# Patient Record
Sex: Female | Born: 1975
Health system: Southern US, Community
[De-identification: ages and names within clinical notes are randomized; demographics above are authoritative.]

## PROBLEM LIST (undated history)

## (undated) DIAGNOSIS — R112 Nausea with vomiting, unspecified: Secondary | ICD-10-CM

## (undated) DIAGNOSIS — Z8719 Personal history of other diseases of the digestive system: Secondary | ICD-10-CM

## (undated) DIAGNOSIS — Z789 Other specified health status: Secondary | ICD-10-CM

## (undated) DIAGNOSIS — Z8711 Personal history of peptic ulcer disease: Secondary | ICD-10-CM

## (undated) DIAGNOSIS — F99 Mental disorder, not otherwise specified: Secondary | ICD-10-CM

## (undated) DIAGNOSIS — Z9889 Other specified postprocedural states: Secondary | ICD-10-CM

## (undated) HISTORY — PX: NASAL SINUS SURGERY: SHX719

## (undated) HISTORY — PX: ESOPHAGOGASTRODUODENOSCOPY: SHX1529

## (undated) HISTORY — PX: TONSILLECTOMY: SUR1361

## (undated) HISTORY — DX: Personal history of peptic ulcer disease: Z87.11

## (undated) HISTORY — DX: Mental disorder, not otherwise specified: F99

---
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
## 2010-07-08 ENCOUNTER — Inpatient Hospital Stay (HOSPITAL_COMMUNITY)
Admission: AD | Admit: 2010-07-08 | Discharge: 2010-07-11 | Payer: Self-pay | Source: Home / Self Care | Attending: Obstetrics and Gynecology | Admitting: Obstetrics and Gynecology

## 2010-07-14 LAB — CBC
HCT: 34.7 % — ABNORMAL LOW (ref 36.0–46.0)
HCT: 29.7 % — ABNORMAL LOW (ref 36.0–46.0)
Hemoglobin: 12.1 g/dL (ref 12.0–15.0)
Hemoglobin: 10.4 g/dL — ABNORMAL LOW (ref 12.0–15.0)
MCH: 31.8 pg (ref 26.0–34.0)
MCH: 31.8 pg (ref 26.0–34.0)
MCHC: 34.9 g/dL (ref 30.0–36.0)
MCHC: 35 g/dL (ref 30.0–36.0)
MCV: 91.3 fL (ref 78.0–100.0)
MCV: 90.8 fL (ref 78.0–100.0)
Platelets: 130 10*3/uL — ABNORMAL LOW (ref 150–400)
Platelets: 123 10*3/uL — ABNORMAL LOW (ref 150–400)
RBC: 3.8 MIL/uL — ABNORMAL LOW (ref 3.87–5.11)
RBC: 3.27 MIL/uL — ABNORMAL LOW (ref 3.87–5.11)
RDW: 13.8 % (ref 11.5–15.5)
RDW: 13.4 % (ref 11.5–15.5)
WBC: 7.1 10*3/uL (ref 4.0–10.5)
WBC: 10 10*3/uL (ref 4.0–10.5)

## 2010-07-14 LAB — RPR: RPR Ser Ql: NONREACTIVE

## 2010-07-18 NOTE — Op Note (Signed)
Carol Rodgers, Carol Rodgers                ACCOUNT NO.:  [id number]  MEDICAL RECORD NO.:  [id number]          PATIENT TYPE:  INP  LOCATION:  9109                          FACILITY:  [facility]  PHYSICIAN:  Avri Paiva L. Peretz Thieme, M.D.DATE OF BIRTH:  [birth year]  DATE OF PROCEDURE:  07/08/2010 DATE OF DISCHARGE:                              OPERATIVE REPORT   PREOPERATIVE DIAGNOSES:  Intrauterine pregnancy at 40 and 4, arrest of dilation, and nonreassuring fetal heart rate.  POSTOPERATIVE DIAGNOSES:  Intrauterine pregnancy at 40 and 4, arrest of dilation, and nonreassuring fetal heart rate.  PROCEDURE:  Primary low transverse cesarean section.  SURGEON:  Janelly Switalski L. Vincente Poli, M.D.  ANESTHESIA:  Epidural.  FINDINGS:  Female infant in OP position weighing 7 pounds 7 ounces.  PATHOLOGY:  None.  ESTIMATED BLOOD LOSS:  500 mL.  COMPLICATIONS:  None.  PROCEDURE:  The patient was taken to the operating room, it was found after her epidural that she started to have kind of an unstable baseline with repetitive deep variable decelerations with late return.  She had noted to still be 1.5 cm dilated.  After risk and benefits were reviewed of the C-section.  The patient and her spouse agreed to proceed, we went back to the operating room #1, her epidural had been dosed by Dr. Malen Gauze and found to be adequate.  She was prepped and draped.  A Foley catheter had been inserted while in labor and delivery.  A low transverse incision was made, carried down to the fascia, the fascia was scored in the midline extended laterally using rectus muscles.  The peritoneum was entered bluntly, and the peritoneal incision was then stretched.  The bladder blade was inserted, the lower uterine segment was then identified and the bladder flap was created sharply and then digitally. The bladder blade was then readjusted, a low transverse incision was made in the uterus.  The uterus was entered using a hemostat.  The baby was  in OT position, was delivered easily, was a female infant Apgars 9 at 1 minute and 9 at 5 minutes and weighed 7 pounds 7 ounces.  The cord was clamped and cut.  The baby was handed to the awaiting pediatricians and taken to the nursery.  The placenta was manually removed, noted to be normal intact with a three-vessel cord.  Antibiotics and Pitocin were given.  The uterus was exteriorized and cleared of all clots and debris. The uterine incision was closed in one layer using 0 chromic in a running locked running locked stitch.  The uterus was returned to the abdomen.  Irrigation was performed.  The peritoneum was reapproximated using 0 Vicryl.  The fascia was closed using 0 Vicryl in running stitch after irrigation of subcutaneous layer noting hemostasis.  The skin was closed with a 4-0 Vicryl on a Keith needle.  Dermabond was applied.  All sponge, lap, and instrument counts were correct x2.  The patient went to recovery room in stable condition     Laveta Gilkey L. Vincente Poli, M.D.     Florestine Avers  D:  07/08/2010  T:  07/09/2010  Job:  [id number]  Electronically  Signed by Marcelle Overlie M.D. on 07/18/2010 08:43:55 AM

## 2010-07-30 NOTE — Discharge Summary (Signed)
Carol Rodgers, MALLINGER                ACCOUNT NO.:  [id number]  MEDICAL RECORD NO.:  [id number]          PATIENT TYPE:  INP  LOCATION:  9109                          FACILITY:  [facility]  PHYSICIAN:  Zelphia Cairo, MD    DATE OF BIRTH:  1976-03-05  DATE OF ADMISSION:  07/08/2010 DATE OF DISCHARGE:  07/11/2010                              DISCHARGE SUMMARY   ADMITTING DIAGNOSES: 1. Intrauterine pregnancy at 84 and 4/7th weeks' estimated gestational     age. 2. Spontaneous rupture of membranes.  DISCHARGE DIAGNOSES: 1. Status post low transverse cesarean section secondary to     nonreassuring fetal heart tones and arrest of dilatation. 2. Viable female infant.  PROCEDURE:  Primary low transverse cesarean section.  REASON FOR ADMISSION:  Please see written H and P.  HOSPITAL COURSE:  The patient is 35 year old primigravida was admitted to [facility] at 5 and 4/7 weeks' with estimated gestational age with spontaneous rupture of membranes.  On admission, vital signs are stable and fetal heart tones are reassuring.  Cervix was examined and found to be 1 cm dilated, 30% effaced, vertex at a -2 station.  The patient was known to be positive group B beta strep and was started on ampicillin.  She was also started on high-dose Pitocin for augmentation of her labor.  The patient continued to have adequate labor.  Cervix was reexamined and found to be 1 cm, 90% effaced, vertex at a -2 station.  Baby was observed to have some variable decelerations due to failure to progress with nonreassuring fetal heart tones. Decision was made to proceed with a primary low transverse cesarean section.  The patient was then transferred to the operating room where epidural was dosed to an adequate surgical level.  A low transverse incision was made with delivery of a viable female infant weighing 7 pounds 7 ounces with Apgars of 9 at 1 minute and 9 at 5 minutes.  The patient was tolerated  procedure well and was taken to recovery room in stable condition.  Postoperative day #1, patient was without complaint. Vital signs were stable.  Abdomen soft.  Fundus firm and nontender. Incision was clean, dry, and intact with subcuticular closure.  Foley was draining with adequate amounts of urine output.  Laboratory findings revealed hemoglobin of 10.4, platelet count 123,000.  Blood type is known to be A negative.  On postoperative day #2, the patient was without complaint.  Vital signs were stable.  Abdomen soft.  Fundus firm and nontender.  Baby was Rh negative, therefore RhoGAM was not indicated.  She is ambulating well.  Postoperative day #3, patient was ready for discharge.  Vital signs were stable.  She remained afebrile. Abdomen soft with good return of bowel function.  Fundus firm and nontender.  Incision was clean, dry, and intact.  Discharge instructions were reviewed and patient was later discharged home.  CONDITION ON DISCHARGE:  Stable.  DIET:  Regular as tolerated.  ACTIVITY:  No heavy lifting, no driving x2 weeks, no vaginal entry.  FOLLOWUP:  Patient to follow up in the office in 1-2 weeks for an incision  check.  She is to call for temperature greater than 100 degrees, persistent nausea, vomiting, heavy vaginal bleeding, and/or redness or drainage from the incisional site.  DISCHARGE MEDICATIONS: 1. Tylox #30 one p.o. every 4-6 hours. 2. Motrin 600 mg every 6 hours. 3. Prenatal vitamins one p.o. daily.     Julio Sicks, N.P.   ______________________________ Zelphia Cairo, MD    CC/MEDQ  D:  07/11/2010  T:  07/11/2010  Job:  [id number]  Electronically Signed by Julio Sicks N.P. on 07/14/2010 08:37:26 AM Electronically Signed by Zelphia Cairo MD on 07/30/2010 08:11:37 PM

## 2011-10-27 LAB — OB RESULTS CONSOLE HIV ANTIBODY (ROUTINE TESTING): HIV: NONREACTIVE

## 2011-10-27 LAB — OB RESULTS CONSOLE HEPATITIS B SURFACE ANTIGEN: Hepatitis B Surface Ag: NEGATIVE

## 2011-10-27 LAB — OB RESULTS CONSOLE ABO/RH: RH Type: NEGATIVE

## 2011-10-27 LAB — OB RESULTS CONSOLE ANTIBODY SCREEN: Antibody Screen: NEGATIVE

## 2011-10-27 LAB — OB RESULTS CONSOLE RUBELLA ANTIBODY, IGM: Rubella: IMMUNE

## 2011-10-27 LAB — OB RESULTS CONSOLE RPR: RPR: NONREACTIVE

## 2012-05-28 ENCOUNTER — Encounter (HOSPITAL_COMMUNITY): Payer: Self-pay | Admitting: Pharmacist

## 2012-06-03 ENCOUNTER — Encounter (HOSPITAL_COMMUNITY): Payer: Self-pay

## 2012-06-06 ENCOUNTER — Encounter (HOSPITAL_COMMUNITY): Payer: Self-pay

## 2012-06-06 ENCOUNTER — Encounter (HOSPITAL_COMMUNITY)
Admission: RE | Admit: 2012-06-06 | Discharge: 2012-06-06 | Disposition: A | Discharge status: Home / Self Care | Payer: 59 | Source: Ambulatory Visit | Attending: Obstetrics and Gynecology | Admitting: Obstetrics and Gynecology

## 2012-06-06 HISTORY — DX: Other specified health status: Z78.9

## 2012-06-06 HISTORY — DX: Other specified postprocedural states: Z98.890

## 2012-06-06 HISTORY — DX: Nausea with vomiting, unspecified: R11.2

## 2012-06-06 LAB — CBC
HCT: 34.2 % — ABNORMAL LOW (ref 36.0–46.0)
Hemoglobin: 11.5 g/dL — ABNORMAL LOW (ref 12.0–15.0)
MCH: 30.7 pg (ref 26.0–34.0)
MCHC: 33.6 g/dL (ref 30.0–36.0)
MCV: 91.2 fL (ref 78.0–100.0)
Platelets: 142 10*3/uL — ABNORMAL LOW (ref 150–400)
RBC: 3.75 MIL/uL — ABNORMAL LOW (ref 3.87–5.11)
RDW: 14.5 % (ref 11.5–15.5)
WBC: 7 10*3/uL (ref 4.0–10.5)

## 2012-06-06 LAB — SURGICAL PCR SCREEN
MRSA, PCR: NEGATIVE
Staphylococcus aureus: NEGATIVE

## 2012-06-06 LAB — TYPE AND SCREEN
ABO/RH(D): A NEG
Antibody Screen: POSITIVE
DAT, IgG: NEGATIVE

## 2012-06-06 LAB — RPR: RPR Ser Ql: NONREACTIVE

## 2012-06-06 NOTE — Patient Instructions (Addendum)
Your procedure is scheduled on:06/07/12  Enter through the Main Entrance at :1130 am Pick up desk phone and dial [phone] and inform us of your arrival.  Please call [phone] if you have any problems the morning of surgery.  Remember: Do not eat after midnight:tonight  Clear liquids ok until 9am Tuesday Take these meds the morning of surgery with a sip of water:NONE  DO NOT wear jewelry, eye make-up, lipstick,body lotion, or dark fingernail polish. Do not shave for 48 hours prior to surgery.  If you are to be admitted after surgery, leave suitcase in car until your room has been assigned.

## 2012-06-06 NOTE — H&P (Addendum)
36 yoG2P1 @ 39 wks presents for rpt c-section.  Pregnancy uncomplicated.  PMHx:  See hollister All:  None  AF, VSS Gen - NAD CV - RRR Lungs - clear bilat Abd - gravid, NT Ext - NT Cvx 1cm  A/P:  Previous c-section, desires rpt Plan of care discussed, questions answered, informed consent

## 2012-06-07 ENCOUNTER — Inpatient Hospital Stay (HOSPITAL_COMMUNITY): Payer: 59 | Admitting: Anesthesiology

## 2012-06-07 ENCOUNTER — Inpatient Hospital Stay (HOSPITAL_COMMUNITY)
Admission: AD | Admit: 2012-06-07 | Discharge: 2012-06-10 | DRG: 765 | Disposition: A | Discharge status: Home / Self Care | Payer: 59 | Source: Ambulatory Visit | Attending: Obstetrics and Gynecology | Admitting: Obstetrics and Gynecology

## 2012-06-07 ENCOUNTER — Encounter (HOSPITAL_COMMUNITY)
Admission: AD | Disposition: A | Discharge status: Home / Self Care | Payer: Self-pay | Source: Ambulatory Visit | Attending: Obstetrics and Gynecology

## 2012-06-07 ENCOUNTER — Encounter (HOSPITAL_COMMUNITY): Payer: Self-pay | Admitting: Anesthesiology

## 2012-06-07 ENCOUNTER — Encounter (HOSPITAL_COMMUNITY): Payer: Self-pay

## 2012-06-07 DIAGNOSIS — Z01812 Encounter for preprocedural laboratory examination: Secondary | ICD-10-CM

## 2012-06-07 DIAGNOSIS — O2390 Unspecified genitourinary tract infection in pregnancy, unspecified trimester: Secondary | ICD-10-CM | POA: Diagnosis not present

## 2012-06-07 DIAGNOSIS — N390 Urinary tract infection, site not specified: Secondary | ICD-10-CM | POA: Diagnosis not present

## 2012-06-07 DIAGNOSIS — Z01818 Encounter for other preprocedural examination: Secondary | ICD-10-CM

## 2012-06-07 DIAGNOSIS — O34219 Maternal care for unspecified type scar from previous cesarean delivery: Principal | ICD-10-CM | POA: Diagnosis present

## 2012-06-07 SURGERY — Surgical Case
Anesthesia: Spinal | Site: Abdomen | Wound class: Clean Contaminated

## 2012-06-07 MED ORDER — KETOROLAC TROMETHAMINE 30 MG/ML IJ SOLN: 30.0000 mg | Freq: Four times a day (QID) | INTRAMUSCULAR | Status: AC | PRN

## 2012-06-07 MED ORDER — DIPHENHYDRAMINE HCL 50 MG/ML IJ SOLN: 12.5000 mg | INTRAMUSCULAR | Status: DC | PRN

## 2012-06-07 MED ORDER — DIPHENHYDRAMINE HCL 25 MG PO CAPS: 25.0000 mg | ORAL_CAPSULE | Freq: Four times a day (QID) | ORAL | Status: DC | PRN

## 2012-06-07 MED ORDER — MORPHINE SULFATE 0.5 MG/ML IJ SOLN
INTRAMUSCULAR | Status: AC
  Filled 2012-06-07: qty 10

## 2012-06-07 MED ORDER — DIBUCAINE 1 % RE OINT: 1.0000 "application " | TOPICAL_OINTMENT | RECTAL | Status: DC | PRN

## 2012-06-07 MED ORDER — CEFAZOLIN SODIUM-DEXTROSE 2-3 GM-% IV SOLR
INTRAVENOUS | Status: AC
  Filled 2012-06-07: qty 50

## 2012-06-07 MED ORDER — BUPIVACAINE IN DEXTROSE 0.75-8.25 % IT SOLN
INTRATHECAL | Status: DC | PRN
  Administered 2012-06-07: 1.6 mL via INTRATHECAL

## 2012-06-07 MED ORDER — NALOXONE HCL 1 MG/ML IJ SOLN: 1.0000 ug/kg/h | INTRAVENOUS | Status: DC | PRN

## 2012-06-07 MED ORDER — SCOPOLAMINE 1 MG/3DAYS TD PT72
1.0000 | MEDICATED_PATCH | Freq: Once | TRANSDERMAL | Status: DC
  Administered 2012-06-07: 1.5 mg via TRANSDERMAL

## 2012-06-07 MED ORDER — MIDAZOLAM HCL 2 MG/2ML IJ SOLN: 0.5000 mg | Freq: Once | INTRAMUSCULAR | Status: DC | PRN

## 2012-06-07 MED ORDER — FENTANYL CITRATE 0.05 MG/ML IJ SOLN: 25.0000 ug | INTRAMUSCULAR | Status: DC | PRN

## 2012-06-07 MED ORDER — WITCH HAZEL-GLYCERIN EX PADS: 1.0000 "application " | MEDICATED_PAD | CUTANEOUS | Status: DC | PRN

## 2012-06-07 MED ORDER — TETANUS-DIPHTH-ACELL PERTUSSIS 5-2.5-18.5 LF-MCG/0.5 IM SUSP: 0.5000 mL | Freq: Once | INTRAMUSCULAR | Status: DC

## 2012-06-07 MED ORDER — EPHEDRINE SULFATE 50 MG/ML IJ SOLN
INTRAMUSCULAR | Status: DC | PRN
  Administered 2012-06-07: 10 mg via INTRAVENOUS

## 2012-06-07 MED ORDER — IBUPROFEN 600 MG PO TABS
600.0000 mg | ORAL_TABLET | Freq: Four times a day (QID) | ORAL | Status: DC
  Administered 2012-06-08 – 2012-06-10 (×10): 600 mg via ORAL
  Filled 2012-06-07 (×10): qty 1

## 2012-06-07 MED ORDER — DIPHENHYDRAMINE HCL 25 MG PO CAPS: 25.0000 mg | ORAL_CAPSULE | ORAL | Status: DC | PRN

## 2012-06-07 MED ORDER — MEPERIDINE HCL 25 MG/ML IJ SOLN: 6.2500 mg | INTRAMUSCULAR | Status: DC | PRN

## 2012-06-07 MED ORDER — METOCLOPRAMIDE HCL 5 MG/ML IJ SOLN: 10.0000 mg | Freq: Three times a day (TID) | INTRAMUSCULAR | Status: DC | PRN

## 2012-06-07 MED ORDER — KETOROLAC TROMETHAMINE 30 MG/ML IJ SOLN
INTRAMUSCULAR | Status: AC
  Administered 2012-06-07: 30 mg via INTRAVENOUS
  Filled 2012-06-07: qty 1

## 2012-06-07 MED ORDER — SCOPOLAMINE 1 MG/3DAYS TD PT72
1.0000 | MEDICATED_PATCH | Freq: Once | TRANSDERMAL | Status: DC
  Filled 2012-06-07: qty 1

## 2012-06-07 MED ORDER — NALOXONE HCL 0.4 MG/ML IJ SOLN: 0.4000 mg | INTRAMUSCULAR | Status: DC | PRN

## 2012-06-07 MED ORDER — CEFAZOLIN SODIUM-DEXTROSE 2-3 GM-% IV SOLR
2.0000 g | INTRAVENOUS | Status: AC
  Administered 2012-06-07: 2 g via INTRAVENOUS

## 2012-06-07 MED ORDER — DEXTROSE IN LACTATED RINGERS 5 % IV SOLN
INTRAVENOUS | Status: DC
  Administered 2012-06-08: 03:00:00 via INTRAVENOUS

## 2012-06-07 MED ORDER — EPHEDRINE 5 MG/ML INJ
INTRAVENOUS | Status: AC
  Filled 2012-06-07: qty 10

## 2012-06-07 MED ORDER — ONDANSETRON HCL 4 MG/2ML IJ SOLN
INTRAMUSCULAR | Status: DC | PRN
  Administered 2012-06-07: 4 mg via INTRAVENOUS

## 2012-06-07 MED ORDER — MENTHOL 3 MG MT LOZG: 1.0000 | LOZENGE | OROMUCOSAL | Status: DC | PRN

## 2012-06-07 MED ORDER — PROMETHAZINE HCL 25 MG/ML IJ SOLN: 6.2500 mg | INTRAMUSCULAR | Status: DC | PRN

## 2012-06-07 MED ORDER — NALBUPHINE SYRINGE 5 MG/0.5 ML
5.0000 mg | INJECTION | INTRAMUSCULAR | Status: DC | PRN
  Filled 2012-06-07: qty 1

## 2012-06-07 MED ORDER — PRENATAL MULTIVITAMIN CH
1.0000 | ORAL_TABLET | Freq: Every day | ORAL | Status: DC
  Administered 2012-06-08 – 2012-06-10 (×3): 1 via ORAL
  Filled 2012-06-07 (×3): qty 1

## 2012-06-07 MED ORDER — OXYTOCIN 40 UNITS IN LACTATED RINGERS INFUSION - SIMPLE MED: 62.5000 mL/h | INTRAVENOUS | Status: AC

## 2012-06-07 MED ORDER — FENTANYL CITRATE 0.05 MG/ML IJ SOLN
INTRAMUSCULAR | Status: DC | PRN
  Administered 2012-06-07: 25 ug via INTRATHECAL

## 2012-06-07 MED ORDER — NALOXONE HCL 1 MG/ML IJ SOLN
1.0000 ug/kg/h | INTRAVENOUS | Status: DC | PRN
  Filled 2012-06-07: qty 2

## 2012-06-07 MED ORDER — SCOPOLAMINE 1 MG/3DAYS TD PT72
MEDICATED_PATCH | TRANSDERMAL | Status: AC
  Administered 2012-06-07: 1.5 mg via TRANSDERMAL
  Filled 2012-06-07: qty 1

## 2012-06-07 MED ORDER — MEASLES, MUMPS & RUBELLA VAC ~~LOC~~ INJ
0.5000 mL | INJECTION | Freq: Once | SUBCUTANEOUS | Status: DC
  Filled 2012-06-07: qty 0.5

## 2012-06-07 MED ORDER — MORPHINE SULFATE (PF) 0.5 MG/ML IJ SOLN
INTRAMUSCULAR | Status: DC | PRN
  Administered 2012-06-07: .1 mg via INTRATHECAL

## 2012-06-07 MED ORDER — ACETAMINOPHEN 10 MG/ML IV SOLN
1000.0000 mg | Freq: Four times a day (QID) | INTRAVENOUS | Status: AC | PRN
  Filled 2012-06-07: qty 100

## 2012-06-07 MED ORDER — OXYCODONE-ACETAMINOPHEN 5-325 MG PO TABS
1.0000 | ORAL_TABLET | ORAL | Status: DC | PRN
  Administered 2012-06-08 (×2): 1 via ORAL
  Administered 2012-06-08: 2 via ORAL
  Administered 2012-06-09 (×3): 1 via ORAL
  Administered 2012-06-09 – 2012-06-10 (×2): 2 via ORAL
  Administered 2012-06-10: 1 via ORAL
  Administered 2012-06-10: 2 via ORAL
  Filled 2012-06-07: qty 2
  Filled 2012-06-07 (×3): qty 1
  Filled 2012-06-07 (×2): qty 2
  Filled 2012-06-07 (×3): qty 1
  Filled 2012-06-07: qty 2

## 2012-06-07 MED ORDER — 0.9 % SODIUM CHLORIDE (POUR BTL) OPTIME
TOPICAL | Status: DC | PRN
  Administered 2012-06-07: 1000 mL

## 2012-06-07 MED ORDER — DIPHENHYDRAMINE HCL 50 MG/ML IJ SOLN: 25.0000 mg | INTRAMUSCULAR | Status: DC | PRN

## 2012-06-07 MED ORDER — OXYTOCIN 10 UNIT/ML IJ SOLN
INTRAMUSCULAR | Status: AC
  Filled 2012-06-07: qty 4

## 2012-06-07 MED ORDER — SODIUM CHLORIDE 0.9 % IJ SOLN: 3.0000 mL | INTRAMUSCULAR | Status: DC | PRN

## 2012-06-07 MED ORDER — KETOROLAC TROMETHAMINE 30 MG/ML IJ SOLN: 30.0000 mg | Freq: Four times a day (QID) | INTRAMUSCULAR | Status: DC | PRN

## 2012-06-07 MED ORDER — LANOLIN HYDROUS EX OINT: 1.0000 "application " | TOPICAL_OINTMENT | CUTANEOUS | Status: DC | PRN

## 2012-06-07 MED ORDER — SIMETHICONE 80 MG PO CHEW: 80.0000 mg | CHEWABLE_TABLET | ORAL | Status: DC | PRN

## 2012-06-07 MED ORDER — PHENYLEPHRINE HCL 10 MG/ML IJ SOLN
INTRAMUSCULAR | Status: DC | PRN
  Administered 2012-06-07: 80 ug via INTRAVENOUS
  Administered 2012-06-07 (×6): 40 ug via INTRAVENOUS

## 2012-06-07 MED ORDER — FENTANYL CITRATE 0.05 MG/ML IJ SOLN
INTRAMUSCULAR | Status: AC
  Filled 2012-06-07: qty 2

## 2012-06-07 MED ORDER — OXYTOCIN 10 UNIT/ML IJ SOLN
40.0000 [IU] | INTRAVENOUS | Status: DC | PRN
  Administered 2012-06-07: 40 [IU] via INTRAVENOUS

## 2012-06-07 MED ORDER — KETOROLAC TROMETHAMINE 30 MG/ML IJ SOLN
30.0000 mg | Freq: Four times a day (QID) | INTRAMUSCULAR | Status: AC | PRN
  Administered 2012-06-07: 30 mg via INTRAVENOUS

## 2012-06-07 MED ORDER — PHENYLEPHRINE 40 MCG/ML (10ML) SYRINGE FOR IV PUSH (FOR BLOOD PRESSURE SUPPORT)
PREFILLED_SYRINGE | INTRAVENOUS | Status: AC
  Filled 2012-06-07: qty 10

## 2012-06-07 MED ORDER — LACTATED RINGERS IV SOLN
INTRAVENOUS | Status: DC
  Administered 2012-06-07: 50 mL/h via INTRAVENOUS
  Administered 2012-06-07: 1000 mL/h via INTRAVENOUS

## 2012-06-07 MED ORDER — SIMETHICONE 80 MG PO CHEW
80.0000 mg | CHEWABLE_TABLET | Freq: Three times a day (TID) | ORAL | Status: DC
  Administered 2012-06-08 – 2012-06-10 (×9): 80 mg via ORAL

## 2012-06-07 MED ORDER — ONDANSETRON HCL 4 MG PO TABS: 4.0000 mg | ORAL_TABLET | ORAL | Status: DC | PRN

## 2012-06-07 MED ORDER — ONDANSETRON HCL 4 MG/2ML IJ SOLN: 4.0000 mg | Freq: Three times a day (TID) | INTRAMUSCULAR | Status: DC | PRN

## 2012-06-07 MED ORDER — LACTATED RINGERS IV SOLN
INTRAVENOUS | Status: DC | PRN
  Administered 2012-06-07 (×4): via INTRAVENOUS

## 2012-06-07 MED ORDER — NALBUPHINE HCL 10 MG/ML IJ SOLN: 5.0000 mg | INTRAMUSCULAR | Status: DC | PRN

## 2012-06-07 MED ORDER — ONDANSETRON HCL 4 MG/2ML IJ SOLN: 4.0000 mg | INTRAMUSCULAR | Status: DC | PRN

## 2012-06-07 MED ORDER — SENNOSIDES-DOCUSATE SODIUM 8.6-50 MG PO TABS
2.0000 | ORAL_TABLET | Freq: Every day | ORAL | Status: DC
  Administered 2012-06-08 – 2012-06-09 (×3): 2 via ORAL

## 2012-06-07 MED ORDER — MEDROXYPROGESTERONE ACETATE 150 MG/ML IM SUSP: 150.0000 mg | INTRAMUSCULAR | Status: DC | PRN

## 2012-06-07 MED ORDER — ONDANSETRON HCL 4 MG/2ML IJ SOLN
INTRAMUSCULAR | Status: AC
  Filled 2012-06-07: qty 2

## 2012-06-07 SURGICAL SUPPLY — 31 items
BENZOIN TINCTURE PRP APPL 2/3 (GAUZE/BANDAGES/DRESSINGS) ×2 IMPLANT
CLOTH BEACON ORANGE TIMEOUT ST (SAFETY) ×2 IMPLANT
DERMABOND ADVANCED (GAUZE/BANDAGES/DRESSINGS)
DERMABOND ADVANCED .7 DNX12 (GAUZE/BANDAGES/DRESSINGS) IMPLANT
DRSG OPSITE POSTOP 4X10 (GAUZE/BANDAGES/DRESSINGS) ×2 IMPLANT
DURAPREP 26ML APPLICATOR (WOUND CARE) ×2 IMPLANT
ELECT REM PT RETURN 9FT ADLT (ELECTROSURGICAL) ×2
ELECTRODE REM PT RTRN 9FT ADLT (ELECTROSURGICAL) ×1 IMPLANT
EXTRACTOR VACUUM M CUP 4 TUBE (SUCTIONS) IMPLANT
GLOVE BIO SURGEON STRL SZ 6.5 (GLOVE) ×2 IMPLANT
GLOVE BIOGEL PI IND STRL 7.0 (GLOVE) ×2 IMPLANT
GLOVE BIOGEL PI INDICATOR 7.0 (GLOVE) ×2
GOWN PREVENTION PLUS LG XLONG (DISPOSABLE) ×6 IMPLANT
GOWN STRL REIN XL XLG (GOWN DISPOSABLE) ×2 IMPLANT
KIT ABG SYR 3ML LUER SLIP (SYRINGE) IMPLANT
NEEDLE HYPO 25X5/8 SAFETYGLIDE (NEEDLE) IMPLANT
NS IRRIG 1000ML POUR BTL (IV SOLUTION) ×2 IMPLANT
PACK C SECTION WH (CUSTOM PROCEDURE TRAY) ×2 IMPLANT
PAD OB MATERNITY 4.3X12.25 (PERSONAL CARE ITEMS) ×2 IMPLANT
SLEEVE SCD COMPRESS KNEE MED (MISCELLANEOUS) IMPLANT
STAPLER VISISTAT 35W (STAPLE) IMPLANT
STRIP CLOSURE SKIN 1/2X4 (GAUZE/BANDAGES/DRESSINGS) ×2 IMPLANT
SUT CHROMIC 0 CT 802H (SUTURE) IMPLANT
SUT CHROMIC 0 CTX 36 (SUTURE) ×6 IMPLANT
SUT MON AB-0 CT1 36 (SUTURE) ×2 IMPLANT
SUT PDS AB 0 CTX 60 (SUTURE) ×2 IMPLANT
SUT PLAIN 0 NONE (SUTURE) IMPLANT
SUT VIC AB 4-0 KS 27 (SUTURE) ×2 IMPLANT
TOWEL OR 17X24 6PK STRL BLUE (TOWEL DISPOSABLE) ×6 IMPLANT
TRAY FOLEY CATH 14FR (SET/KITS/TRAYS/PACK) ×2 IMPLANT
WATER STERILE IRR 1000ML POUR (IV SOLUTION) ×2 IMPLANT

## 2012-06-07 NOTE — Transfer of Care (Signed)
Immediate Anesthesia Transfer of Care Note  Patient: [facility]  Procedure(s) Performed: Procedure(s) (LRB) with comments: CESAREAN SECTION (N/A) - Repeat edc 06/10/12  Patient Location: PACU  Anesthesia Type:Spinal  Level of Consciousness: awake, alert  and oriented  Airway & Oxygen Therapy: Patient Spontanous Breathing  Post-op Assessment: Report given to PACU RN and Post -op Vital signs reviewed and stable  Post vital signs: stable  Complications: No apparent anesthesia complications

## 2012-06-07 NOTE — Anesthesia Preprocedure Evaluation (Signed)
Anesthesia Evaluation  Patient identified by MRN, date of birth, ID band Patient awake    Reviewed: Allergy & Precautions, H&P , NPO status , Patient's Chart, lab work & pertinent test results  History of Anesthesia Complications (+) PONV  Airway Mallampati: II      Dental No notable dental hx.    Pulmonary neg pulmonary ROS,  breath sounds clear to auscultation  Pulmonary exam normal       Cardiovascular Exercise Tolerance: Good negative cardio ROS  Rhythm:regular Rate:Normal     Neuro/Psych negative neurological ROS  negative psych ROS   GI/Hepatic negative GI ROS, Neg liver ROS,   Endo/Other  negative endocrine ROS  Renal/GU negative Renal ROS  negative genitourinary   Musculoskeletal   Abdominal Normal abdominal exam  (+)   Peds  Hematology negative hematology ROS (+)   Anesthesia Other Findings   Reproductive/Obstetrics (+) Pregnancy                           Anesthesia Physical Anesthesia Plan  ASA: II  Anesthesia Plan: Spinal   Post-op Pain Management:    Induction:   Airway Management Planned:   Additional Equipment:   Intra-op Plan:   Post-operative Plan:   Informed Consent: I have reviewed the patients History and Physical, chart, labs and discussed the procedure including the risks, benefits and alternatives for the proposed anesthesia with the patient or authorized representative who has indicated his/her understanding and acceptance.     Plan Discussed with: Anesthesiologist, CRNA and Surgeon  Anesthesia Plan Comments:         Anesthesia Quick Evaluation

## 2012-06-07 NOTE — Op Note (Signed)
Cesarean Section Procedure Note   [facility]  06/07/2012  Indications: Scheduled Proceedure/Maternal Request   Pre-operative Diagnosis: previous.   Post-operative Diagnosis: Same   Surgeon: Surgeon(s) and Role:    * Zelphia Cairo, MD - Primary    * Juluis Mire, MD - Assisting   Assistants: Richardean Chimera  Anesthesia: spinal   Procedure Details:  The patient was seen in the Holding Room. The risks, benefits, complications, treatment options, and expected outcomes were discussed with the patient. The patient concurred with the proposed plan, giving informed consent. identified as [facility] and the procedure verified as C-Section Delivery. A Time Out was held and the above information confirmed.  After induction of anesthesia, the patient was draped and prepped in the usual sterile manner. A transverse was made and carried down through the subcutaneous tissue to the fascia. Fascial incision was made and extended transversely. The fascia was separated from the underlying rectus tissue superiorly and inferiorly. The peritoneum was identified and entered. Peritoneal incision was extended longitudinally. The utero-vesical peritoneal reflection was incised transversely and the bladder flap was bluntly freed from the lower uterine segment. A low transverse uterine incision was made. Delivered from cephalic presentation was a vigerous with Apgar scores of 9 at one minute and 9 at five minutes. Cord ph was not sent the umbilical cord was clamped and cut cord blood was obtained for evaluation. The placenta was removed Intact and appeared normal. The uterine outline, tubes and ovaries appeared normal}. The uterine incision was closed with running locked sutures of 0chromic gut.   Hemostasis was observed. Lavage was carried out until clear. The fascia was then reapproximated with running sutures of 0PDS. The skin was closed with 4-0Vicryl.   Instrument, sponge, and needle counts were correct  prior the abdominal closure and were correct at the conclusion of the case.     Estimated Blood Loss: *500cc   Urine Output: clear  Specimens: @ORSPECIMEN @   Complications: no complications  Disposition: PACU - hemodynamically stable.   Maternal Condition: stable   Baby condition / location:  OR with mom and dad  Attending Attestation: I was present and scrubbed for the entire procedure.   Signed: Surgeon(s): Zelphia Cairo, MD Juluis Mire, MD

## 2012-06-07 NOTE — Anesthesia Procedure Notes (Signed)
Spinal  Patient location during procedure: OR Start time: 06/07/2012 1:33 PM Staffing Anesthesiologist: Brayton Caves R Performed by: anesthesiologist  Preanesthetic Checklist Completed: patient identified, site marked, surgical consent, pre-op evaluation, timeout performed, IV checked, risks and benefits discussed and monitors and equipment checked Spinal Block Patient position: sitting Prep: DuraPrep Patient monitoring: heart rate, cardiac monitor, continuous pulse ox and blood pressure Approach: midline Location: L3-4 Injection technique: single-shot Needle Needle type: Sprotte  Needle gauge: 24 G Needle length: 9 cm Assessment Sensory level: T4 Additional Notes Patient identified.  Risk benefits discussed including failed block, incomplete pain control, headache, nerve damage, paralysis, blood pressure changes, nausea, vomiting, reactions to medication both toxic or allergic, and postpartum back pain.  Patient expressed understanding and wished to proceed.  All questions were answered.  Sterile technique used throughout procedure.  CSF was clear.  No parasthesia or other complications.  Please see nursing notes for vital signs.

## 2012-06-07 NOTE — Anesthesia Postprocedure Evaluation (Signed)
Anesthesia Post Note  Patient: [facility]  Procedure(s) Performed: Procedure(s) (LRB): CESAREAN SECTION (N/A)  Anesthesia type: Spinal  Patient location: PACU  Post pain: Pain level controlled  Post assessment: Post-op Vital signs reviewed  Last Vitals:  Filed Vitals:   06/07/12 1445  BP: 98/51  Pulse: 54  Temp:   Resp: 20    Post vital signs: Reviewed  Level of consciousness: awake  Complications: No apparent anesthesia complications

## 2012-06-08 ENCOUNTER — Encounter (HOSPITAL_COMMUNITY): Payer: Self-pay | Admitting: *Deleted

## 2012-06-08 LAB — CBC
HCT: 30.1 % — ABNORMAL LOW (ref 36.0–46.0)
RDW: 14.1 % (ref 11.5–15.5)
WBC: 9 10*3/uL (ref 4.0–10.5)

## 2012-06-08 MED ORDER — HYDROMORPHONE HCL PF 1 MG/ML IJ SOLN
INTRAMUSCULAR | Status: AC
  Filled 2012-06-08: qty 1

## 2012-06-08 NOTE — Progress Notes (Signed)
Subjective: Postpartum Day 1: Cesarean Delivery Patient reports tolerating PO and no problems voiding.    Objective: Vital signs in last 24 hours: Temp:  [96 F (35.6 C)-99.2 F (37.3 C)] 97.6 F (36.4 C) (12/11 0609) Pulse Rate:  [46-106] 71  (12/11 0609) Resp:  [16-24] 18  (12/11 0609) BP: (86-129)/(39-76) 86/47 mmHg (12/11 0609) SpO2:  [98 %-100 %] 98 % (12/11 0238)  Physical Exam:  General: alert and cooperative Lochia: appropriate Uterine Fundus: firm Incision: healing well, honeycomb dressing noted DVT Evaluation: No evidence of DVT seen on physical exam. Negative Homan's sign. No cords or calf tenderness. No significant calf/ankle edema.   Basename 06/08/12 0625 06/06/12 1133  HGB 10.4* 11.5*  HCT 30.1* 34.2*    Assessment/Plan: Status post Cesarean section. Doing well postoperatively.  Continue current care.  Rena Hunke G 06/08/2012, 8:04 AM

## 2012-06-08 NOTE — Anesthesia Postprocedure Evaluation (Signed)
  Anesthesia Post-op Note  Patient: [facility]  Procedure(s) Performed: Procedure(s) (LRB) with comments: CESAREAN SECTION (N/A) - Repeat edc 06/10/12  Patient Location: PACU and Mother/Baby  Anesthesia Type:Spinal  Level of Consciousness: awake, alert  and oriented  Airway and Oxygen Therapy: Patient Spontanous Breathing  Post-op Pain: none  Post-op Assessment: Post-op Vital signs reviewed  Post-op Vital Signs: Reviewed and stable  Complications: No apparent anesthesia complications

## 2012-06-08 NOTE — Addendum Note (Signed)
Addendum  created 06/08/12 1927 by Armanda Heritage, RN   Modules edited:Charges VN, Notes Section

## 2012-06-09 LAB — CBC
Hemoglobin: 9.7 g/dL — ABNORMAL LOW (ref 12.0–15.0)
MCH: 30.7 pg (ref 26.0–34.0)
RBC: 3.16 MIL/uL — ABNORMAL LOW (ref 3.87–5.11)

## 2012-06-09 LAB — BIRTH TISSUE RECOVERY COLLECTION (PLACENTA DONATION)

## 2012-06-09 MED ORDER — AMOXICILLIN 500 MG PO CAPS
500.0000 mg | ORAL_CAPSULE | Freq: Three times a day (TID) | ORAL | Status: DC
  Administered 2012-06-09 – 2012-06-10 (×4): 500 mg via ORAL
  Filled 2012-06-09 (×8): qty 1

## 2012-06-09 NOTE — Progress Notes (Signed)
Subjective: Postpartum Day 2: Cesarean Delivery Patient reports tolerating PO and + flatus.  C/o dysuria and R shoulder pain  Objective: Vital signs in last 24 hours: Temp:  [97.5 F (36.4 C)-98.2 F (36.8 C)] 97.5 F (36.4 C) (12/12 0535) Pulse Rate:  [60-106] 80  (12/12 0535) Resp:  [18] 18  (12/12 0535) BP: (91-110)/(51-74) 91/53 mmHg (12/12 0535) SpO2:  [99 %] 99 % (12/11 1430)  Physical Exam:  General: alert and cooperative Lochia: appropriate Uterine Fundus: firm Incision: honeycomb dressing noted DVT Evaluation: No evidence of DVT seen on physical exam. No significant calf/ankle edema. Urine obtained for culture  [facility] 06/09/12 0525 06/08/12 0625  HGB 9.7* 10.4*  HCT 29.4* 30.1*    Assessment/Plan: Status post Cesarean section. Postoperative course complicated by questionable UTI  Urine culture sent and Amoxicillin started this am.  [facility] 06/09/2012, 8:22 AM

## 2012-06-10 ENCOUNTER — Encounter (HOSPITAL_COMMUNITY): Payer: Self-pay | Admitting: Obstetrics and Gynecology

## 2012-06-10 MED ORDER — IBUPROFEN 600 MG PO TABS: 600.0000 mg | ORAL_TABLET | Freq: Four times a day (QID) | ORAL | Status: DC

## 2012-06-10 MED ORDER — AMOXICILLIN 500 MG PO CAPS: 500.0000 mg | ORAL_CAPSULE | Freq: Three times a day (TID) | ORAL | Status: DC

## 2012-06-10 MED ORDER — OXYCODONE-ACETAMINOPHEN 5-325 MG PO TABS: 1.0000 | ORAL_TABLET | ORAL | Status: DC | PRN

## 2012-06-10 NOTE — Discharge Summary (Signed)
Obstetric Discharge Summary Reason for Admission: cesarean section Prenatal Procedures: ultrasound Intrapartum Procedures: cesarean: low cervical, transverse Postpartum Procedures: none Complications-Operative and Postpartum: none Hemoglobin  Date Value Range Status  06/09/2012 9.7* 12.0 - 15.0 g/dL Final     HCT  Date Value Range Status  06/09/2012 29.4* 36.0 - 46.0 % Final    Physical Exam:  General: alert and cooperative Lochia: appropriate Uterine Fundus: firm Incision: healing well, honeycomb dressing noted DVT Evaluation: No evidence of DVT seen on physical exam. No significant calf/ankle edema.  Discharge Diagnoses: Term Pregnancy-delivered  Discharge Information: Date: 06/10/2012 Activity: pelvic rest Diet: routine Medications: PNV, Ibuprofen, Percocet and amoxicillin Condition: stable Instructions: refer to practice specific booklet Discharge to: home   Newborn Data: Live born female  Birth Weight: 8 lb 7.1 oz (3830 g) APGAR: 9, 9  Home with mother.  [facility] G 06/10/2012, 8:51 AM

## 2012-06-11 LAB — URINE CULTURE
Colony Count: 35000
Special Requests: NORMAL

## 2013-01-20 ENCOUNTER — Ambulatory Visit (INDEPENDENT_AMBULATORY_CARE_PROVIDER_SITE_OTHER): Payer: 59 | Admitting: Physician Assistant

## 2013-01-20 ENCOUNTER — Encounter: Payer: Self-pay | Admitting: Physician Assistant

## 2013-01-20 VITALS — BP 112/69 | HR 92 | Ht 67.0 in | Wt 115.0 lb

## 2013-01-20 DIAGNOSIS — M6289 Other specified disorders of muscle: Secondary | ICD-10-CM

## 2013-01-20 DIAGNOSIS — M629 Disorder of muscle, unspecified: Secondary | ICD-10-CM

## 2013-01-20 DIAGNOSIS — R5381 Other malaise: Secondary | ICD-10-CM

## 2013-01-20 DIAGNOSIS — M2550 Pain in unspecified joint: Secondary | ICD-10-CM

## 2013-01-20 NOTE — Patient Instructions (Addendum)

## 2013-01-20 NOTE — Progress Notes (Signed)
  Subjective:    Patient ID: Carol [facility], female    DOB: 1975/10/10, 37 y.o.   MRN: [id number]  HPI Patient is a 37 yo female who presents to the clinic to establish care. PMH negative for any ongoing medical conditions. She has had 2 C-sections. Does not smoke and drinks occasionally. Does not exercise regularly. Up to date of vaccines. Last pap 2013.   Pt is concerned because she is having a lot of fatigue and brain fog. She feels like she has battled fatigued for many years but seems like new thing of body aches and overall brain fog. She almost feels like she is getting the flu everyday. She sleeps well at night and does not snore. She is always tired even after a good nights rest. She stays at home with the kids and does not feel overly stressed. She had her son 7 months ago and feels like things have been worse. Denies any hot or cold intolerance. All her joints feel stiff. Denies any swelling of her joints. She takes a prenatal vitamin with iron every day. She sometimes feels tender to even touch.Denies any SOB, CP, palpitations, n/v/d, abdominal pain, vision changes, problems dropping things. She has experienced a lot of hair loss recently.    Review of Systems  All other systems reviewed and are negative.       Objective:   Physical Exam  Constitutional: She is oriented to person, place, and time. She appears well-developed and well-nourished.  HENT:  Head: Normocephalic and atraumatic.  Eyes: Conjunctivae are normal.  Neck: Normal range of motion. Neck supple.  Cardiovascular: Normal rate, regular rhythm and normal heart sounds.   Pulmonary/Chest: Effort normal and breath sounds normal.  Lymphadenopathy:    She has no cervical adenopathy.  Neurological: She is alert and oriented to person, place, and time.  Skin: Skin is warm and dry.  Psychiatric: She has a normal mood and affect. Her behavior is normal.          Assessment & Plan:  Fatigue/brain fog/joint pain-  will check fatigue panel and look at vitamins and anemia. PHQ-9 was 12. Discussed trying something that might give more energy, help with fatigue etc. Pt declined today and wanted to wait until done breastfeeding.

## 2013-01-21 ENCOUNTER — Encounter: Payer: Self-pay | Admitting: Physician Assistant

## 2013-01-21 LAB — CBC
Hemoglobin: 13.6 g/dL (ref 12.0–15.0)
MCH: 30.2 pg (ref 26.0–34.0)
MCV: 87.3 fL (ref 78.0–100.0)
RBC: 4.5 MIL/uL (ref 3.87–5.11)

## 2013-01-21 LAB — FERRITIN: Ferritin: 55 ng/mL (ref 10–291)

## 2013-01-21 LAB — SEDIMENTATION RATE: Sed Rate: 4 mm/hr (ref 0–22)

## 2013-01-23 LAB — ANA: Anti Nuclear Antibody(ANA): NEGATIVE

## 2013-03-02 ENCOUNTER — Ambulatory Visit: Payer: 59 | Admitting: Family Medicine

## 2013-09-15 ENCOUNTER — Ambulatory Visit (INDEPENDENT_AMBULATORY_CARE_PROVIDER_SITE_OTHER): Payer: 59 | Admitting: Physician Assistant

## 2013-09-15 ENCOUNTER — Encounter: Payer: Self-pay | Admitting: Physician Assistant

## 2013-09-15 VITALS — BP 115/71 | HR 72 | Ht 67.0 in | Wt 115.0 lb

## 2013-09-15 DIAGNOSIS — R5383 Other fatigue: Secondary | ICD-10-CM

## 2013-09-15 DIAGNOSIS — F3289 Other specified depressive episodes: Secondary | ICD-10-CM

## 2013-09-15 DIAGNOSIS — R21 Rash and other nonspecific skin eruption: Secondary | ICD-10-CM

## 2013-09-15 DIAGNOSIS — F329 Major depressive disorder, single episode, unspecified: Secondary | ICD-10-CM

## 2013-09-15 DIAGNOSIS — R5381 Other malaise: Secondary | ICD-10-CM

## 2013-09-15 DIAGNOSIS — F411 Generalized anxiety disorder: Secondary | ICD-10-CM

## 2013-09-15 DIAGNOSIS — F32A Depression, unspecified: Secondary | ICD-10-CM | POA: Insufficient documentation

## 2013-09-15 DIAGNOSIS — G479 Sleep disorder, unspecified: Secondary | ICD-10-CM

## 2013-09-15 MED ORDER — ESCITALOPRAM OXALATE 5 MG PO TABS: 5.0000 mg | ORAL_TABLET | Freq: Every day | ORAL | Status: DC

## 2013-09-15 MED ORDER — TRIAMCINOLONE ACETONIDE 0.1 % EX CREA: 1.0000 "application " | TOPICAL_CREAM | Freq: Two times a day (BID) | CUTANEOUS | Status: DC

## 2013-09-15 NOTE — Patient Instructions (Signed)
Melatonin 3mg  and increase to 10mg  OTC.

## 2013-09-15 NOTE — Progress Notes (Signed)
   Subjective:    Patient ID: Carol Rodgers, female    DOB: [birth year], 38 y.o.   MRN: [id number]  HPI Pt is a 38 yo female who presents to the clinic to follow up on fatigue symptoms. Pt was BF at last visit and did not want to start any medications. She did start to wean her son of BF and felt much better for about 2 months then she started having fatigue symptoms again. She denies any particular situation making her down or anxious. She always feels like she has the blues but also worried about life as well. It makes her feel guilty when she does not want to play with her kids. It was so pretty out side last week and her kids wanted to go outside and she just despised getting outside. Not tried any medications. She does have some problems getting good rest. She just tosses and turns and has trouble settling down. No suicidal or homicidal thoughts.   She noticed a rash on her right crease of elbow. Itchy. Just noticed 2-3 days ago but her son's steroid cream and did help but came back.     Review of Systems     Objective:   Physical Exam  Constitutional: She is oriented to person, place, and time. She appears well-developed and well-nourished.  HENT:  Head: Normocephalic and atraumatic.  Cardiovascular: Normal rate, regular rhythm and normal heart sounds.   Pulmonary/Chest: Effort normal and breath sounds normal. She has no wheezes.  Neurological: She is alert and oriented to person, place, and time.  Skin: Skin is dry.  Right antecubital fossa area of linear erythematous patch with tiny papules on base.   Psychiatric: She has a normal mood and affect. Her behavior is normal.          Assessment & Plan:  Anxiety/Depression/Fatigue- GAD-7 was 14. PHQ-9 was 15. Discussed with pt she definetly has some depression/anxiety going on and this can cause fatigue. Fatigue work up was negative last year. I do not want any additional blood work at this time. I would like to try lexapro 5mg   daily. Discussed SE of libido loss, weight gain, nausea, and worsening depression. Pt aware. Pt clear it takes 4-6 weeks to get to therapetic dose. If not tolerating can call office and stop medication. F/u in 4-6 weeks. Consider increasing protein and make sure eating regularly for nutrition.   Sleeping difficulities- certainly could be in part due to anxiety and depression. Suggested to try OTC melatonin 3mg  to 10mg  1 hour before bed.   Rash- appears to be eczema possible contact dermatitis. Will send triamcinolone cream to use BID. Discussed avoidance of lotions that could dry out.   Spent 30 minutes with pt and greater than 50 percent of visit spent counseling pt reguarding fatigue/anxiety/depression.

## 2013-10-13 ENCOUNTER — Ambulatory Visit (INDEPENDENT_AMBULATORY_CARE_PROVIDER_SITE_OTHER): Payer: 59 | Admitting: Physician Assistant

## 2013-10-13 ENCOUNTER — Encounter: Payer: Self-pay | Admitting: Physician Assistant

## 2013-10-13 VITALS — BP 112/66 | HR 81 | Ht 67.0 in | Wt 112.0 lb

## 2013-10-13 DIAGNOSIS — G479 Sleep disorder, unspecified: Secondary | ICD-10-CM

## 2013-10-13 DIAGNOSIS — R5381 Other malaise: Secondary | ICD-10-CM

## 2013-10-13 DIAGNOSIS — R5382 Chronic fatigue, unspecified: Secondary | ICD-10-CM | POA: Insufficient documentation

## 2013-10-13 DIAGNOSIS — F329 Major depressive disorder, single episode, unspecified: Secondary | ICD-10-CM

## 2013-10-13 DIAGNOSIS — F411 Generalized anxiety disorder: Secondary | ICD-10-CM

## 2013-10-13 DIAGNOSIS — F3289 Other specified depressive episodes: Secondary | ICD-10-CM

## 2013-10-13 DIAGNOSIS — R5383 Other fatigue: Secondary | ICD-10-CM

## 2013-10-13 DIAGNOSIS — F32A Depression, unspecified: Secondary | ICD-10-CM

## 2013-10-13 MED ORDER — VENLAFAXINE HCL ER 37.5 MG PO CP24: 37.5000 mg | ORAL_CAPSULE | Freq: Every day | ORAL | Status: DC

## 2013-10-13 NOTE — Progress Notes (Signed)
   Subjective:    Patient ID: Carol Rodgers, female    DOB: March 18, 1976, 38 y.o.   MRN: [id number]  HPI Pt is a 38 yo female who presents to the clinic to follow up on anxiety, depression, sleeping issues. She reports that her sleeping issues have improved since starting melatonin but still wakes up at least once at night. Her anxiety and depression is unchanged and she feels like she is more zombie like now on medication. No motivation. Did have nausea at beginning but did resolve on its on. She does not feel like medication working. Denies any suicidal or homicidal thoughts.    Review of Systems     Objective:   Physical Exam  Constitutional: She is oriented to person, place, and time. She appears well-developed and well-nourished.  HENT:  Head: Normocephalic and atraumatic.  Cardiovascular: Normal rate, regular rhythm and normal heart sounds.   Pulmonary/Chest: Effort normal and breath sounds normal.  Neurological: She is alert and oriented to person, place, and time.  Skin: Skin is dry.  Psychiatric: She has a normal mood and affect. Her behavior is normal.          Assessment & Plan:  Anxiety/depression/fatigue- GAD-7 was 13. PHQ-9 was 14. Stop lexapro. Start effexor 37.5. My hope is that this medication will help activate/motiavate her more. Discussed potential side effects. Please call if not able to tolerate. Mentioned psychiatrist evaluation if not seeing any improvement with medications. Encouraged exercise. We have done a comprehensive fatigue work up which was negative.   Sleeping difficulties- Continue on melatonin can increase to 10mg  if needed for sleep. I don't want to add anything at this point until anxiety and depression are controlled.

## 2013-12-08 ENCOUNTER — Other Ambulatory Visit: Payer: Self-pay | Admitting: Physician Assistant

## 2013-12-18 ENCOUNTER — Other Ambulatory Visit: Payer: Self-pay | Admitting: *Deleted

## 2013-12-18 ENCOUNTER — Telehealth: Payer: Self-pay | Admitting: *Deleted

## 2013-12-18 MED ORDER — VENLAFAXINE HCL ER 37.5 MG PO CP24: ORAL_CAPSULE | ORAL | Status: DC

## 2013-12-18 NOTE — Telephone Encounter (Signed)
Patient is requesting refill for VENLAFAXINE XR is it okay to send?

## 2013-12-18 NOTE — Telephone Encounter (Signed)
Yes but needs refill in next month since we change rx from lexapro.

## 2013-12-25 ENCOUNTER — Other Ambulatory Visit: Payer: Self-pay | Admitting: *Deleted

## 2013-12-25 MED ORDER — VENLAFAXINE HCL ER 37.5 MG PO CP24: ORAL_CAPSULE | ORAL | Status: DC

## 2014-02-12 ENCOUNTER — Other Ambulatory Visit: Payer: Self-pay | Admitting: Physician Assistant

## 2014-04-05 ENCOUNTER — Ambulatory Visit (INDEPENDENT_AMBULATORY_CARE_PROVIDER_SITE_OTHER): Payer: 59 | Admitting: Physician Assistant

## 2014-04-05 ENCOUNTER — Encounter: Payer: Self-pay | Admitting: Physician Assistant

## 2014-04-05 VITALS — BP 91/60 | HR 78 | Resp 16 | Wt 114.0 lb

## 2014-04-05 DIAGNOSIS — F5101 Primary insomnia: Secondary | ICD-10-CM | POA: Insufficient documentation

## 2014-04-05 DIAGNOSIS — G479 Sleep disorder, unspecified: Secondary | ICD-10-CM

## 2014-04-05 DIAGNOSIS — F329 Major depressive disorder, single episode, unspecified: Secondary | ICD-10-CM

## 2014-04-05 DIAGNOSIS — F32A Depression, unspecified: Secondary | ICD-10-CM

## 2014-04-05 DIAGNOSIS — F411 Generalized anxiety disorder: Secondary | ICD-10-CM

## 2014-04-05 DIAGNOSIS — G4700 Insomnia, unspecified: Secondary | ICD-10-CM

## 2014-04-05 DIAGNOSIS — Z23 Encounter for immunization: Secondary | ICD-10-CM

## 2014-04-05 MED ORDER — TRAZODONE HCL 50 MG PO TABS: 25.0000 mg | ORAL_TABLET | Freq: Every evening | ORAL | Status: DC | PRN

## 2014-04-05 MED ORDER — VENLAFAXINE HCL ER 37.5 MG PO CP24: ORAL_CAPSULE | ORAL | Status: DC

## 2014-04-05 NOTE — Progress Notes (Signed)
   Subjective:    Patient ID: Carol Rodgers, female    DOB: 1975/07/08, 38 y.o.   MRN: [id number]  HPI Pt presents to the clinic to follow up on anxiety and depression. Started effexor doing great. Noticed tremendous improvement. Much less anxiety and depression. More energy during the day. Less waves of anxiety.   She continues to not be able to stay asleep at night. Melatonin helps her get to sleep but she still wakes up frequently. Never tried any medications.    Review of Systems  All other systems reviewed and are negative.      Objective:   Physical Exam  Constitutional: She is oriented to person, place, and time. She appears well-developed and well-nourished.  HENT:  Head: Normocephalic and atraumatic.  Cardiovascular: Normal rate, regular rhythm and normal heart sounds.   Pulmonary/Chest: Effort normal and breath sounds normal.  Neurological: She is alert and oriented to person, place, and time.  Skin: Skin is dry.  Psychiatric: She has a normal mood and affect. Her behavior is normal.          Assessment & Plan:  Anxiety/depression- GAD-7 was 5 down from 13. PHQ-9 was 3 down from 14. Doing much better. Refilled effexor for 6 months.   Sleeping difficulties/insomnia- will try trazodone. Discussed side effects. Follow up in 2 months.   Flu shot given without complication today.

## 2014-04-30 ENCOUNTER — Encounter: Payer: Self-pay | Admitting: Physician Assistant

## 2014-06-06 ENCOUNTER — Other Ambulatory Visit: Payer: Self-pay | Admitting: Physician Assistant

## 2014-07-30 ENCOUNTER — Ambulatory Visit (INDEPENDENT_AMBULATORY_CARE_PROVIDER_SITE_OTHER): Payer: 59 | Admitting: Physician Assistant

## 2014-07-30 ENCOUNTER — Encounter: Payer: Self-pay | Admitting: Physician Assistant

## 2014-07-30 VITALS — BP 104/56 | HR 78 | Temp 97.6°F | Wt 115.0 lb

## 2014-07-30 DIAGNOSIS — R5383 Other fatigue: Secondary | ICD-10-CM

## 2014-07-30 DIAGNOSIS — G4700 Insomnia, unspecified: Secondary | ICD-10-CM

## 2014-07-30 DIAGNOSIS — F411 Generalized anxiety disorder: Secondary | ICD-10-CM

## 2014-07-30 DIAGNOSIS — F32A Depression, unspecified: Secondary | ICD-10-CM

## 2014-07-30 DIAGNOSIS — F329 Major depressive disorder, single episode, unspecified: Secondary | ICD-10-CM

## 2014-07-30 NOTE — Patient Instructions (Signed)

## 2014-07-31 LAB — T4, FREE: Free T4: 1.17 ng/dL (ref 0.80–1.80)

## 2014-07-31 LAB — VITAMIN D 25 HYDROXY (VIT D DEFICIENCY, FRACTURES): VIT D 25 HYDROXY: 22 ng/mL — AB (ref 30–100)

## 2014-07-31 LAB — TSH: TSH: 1.744 u[IU]/mL (ref 0.350–4.500)

## 2014-07-31 LAB — VITAMIN B12: Vitamin B-12: 384 pg/mL (ref 211–911)

## 2014-07-31 LAB — THYROID PEROXIDASE ANTIBODY: Thyroperoxidase Ab SerPl-aCnc: 1 IU/mL (ref <9)

## 2014-07-31 MED ORDER — TRAZODONE HCL 50 MG PO TABS: ORAL_TABLET | ORAL | Status: DC

## 2014-07-31 NOTE — Progress Notes (Signed)
   Subjective:    Patient ID: Carol Rodgers, female    DOB: 03/11/1976, 39 y.o.   MRN: [id number]  HPI Pt presents to the clinic to check thyroid levels. She has noticed increase fatigue and dentist told her one side of thyroid felt enlarged. No other problems except feeling fatigue. Screened positive for depression and anxiety. Starting effexor helped with fatigue orginally but now seems to have similar symptoms.    Review of Systems  All other systems reviewed and are negative.      Objective:   Physical Exam  Constitutional: She is oriented to person, place, and time. She appears well-developed and well-nourished.  HENT:  Head: Normocephalic and atraumatic.  Neck: Normal range of motion. Neck supple. No thyromegaly present.  Cardiovascular: Normal rate, regular rhythm and normal heart sounds.   Pulmonary/Chest: Effort normal and breath sounds normal. She has no wheezes.  Lymphadenopathy:    She has no cervical adenopathy.  Neurological: She is alert and oriented to person, place, and time.  Skin: Skin is dry.  Psychiatric: She has a normal mood and affect. Her behavior is normal.          Assessment & Plan:  Fatigue- unclear etiology. Last year metabolic panel for fatigued checked and normal. STOP bang low risk.no snoring. No infection. Pt has had some increase in fatigue over last month. Would like evaluated. Reassured no thyroid enlargement palpated today. Will evaluate hormones.   Insomnia- refilled trazodone as needed.   Depression/anxiety state- GAD-7 was 7. PHQ-9 was 12. Discussed depression is still relatively unchanged from when started effexor. My suggestion if no metabolic reason for fatigue to increase effexor to 75mg  daily.

## 2014-10-03 ENCOUNTER — Other Ambulatory Visit: Payer: Self-pay | Admitting: *Deleted

## 2014-10-03 NOTE — Telephone Encounter (Signed)
Patient called stating that she received a letter from her [place]  with a personal note stating that she wasn't taking her anti-depressant medication properly. And that they had sent a questionnaire form to her PCP. Asked Amber about it & she knew nothing of it.

## 2014-10-05 ENCOUNTER — Other Ambulatory Visit: Payer: Self-pay | Admitting: Physician Assistant

## 2014-10-11 ENCOUNTER — Telehealth: Payer: Self-pay | Admitting: *Deleted

## 2014-10-11 ENCOUNTER — Other Ambulatory Visit: Payer: Self-pay | Admitting: *Deleted

## 2014-10-11 MED ORDER — VENLAFAXINE HCL ER 37.5 MG PO CP24: ORAL_CAPSULE | ORAL | Status: DC

## 2014-10-11 NOTE — Telephone Encounter (Signed)
I am approving you to send in medicaiton for one month.

## 2014-10-11 NOTE — Telephone Encounter (Signed)
Patient stated that today is the 4/14 & her medicine doesn't come until 4/21 And that she only has 1 tablet left. So maybe she should just go ahead  & get a month of Effexor just in case

## 2014-10-11 NOTE — Telephone Encounter (Signed)
Patient called to see if she could have a  week worth of Rx for Effexor. She get's her medicines thru [facility] but her medicine aren't due until 4/21.

## 2014-10-11 NOTE — Telephone Encounter (Signed)
Ok for a week or a month if same price. Ok to send for one month if she only wants to pick up a week let pt decide.

## 2015-02-07 ENCOUNTER — Other Ambulatory Visit: Payer: Self-pay | Admitting: Physician Assistant

## 2015-05-08 ENCOUNTER — Other Ambulatory Visit: Payer: Self-pay | Admitting: Physician Assistant

## 2015-06-14 ENCOUNTER — Other Ambulatory Visit: Payer: Self-pay

## 2015-06-14 MED ORDER — VENLAFAXINE HCL ER 37.5 MG PO CP24: 37.5000 mg | ORAL_CAPSULE | Freq: Every day | ORAL | Status: DC

## 2015-08-08 ENCOUNTER — Telehealth: Payer: Self-pay | Admitting: Physician Assistant

## 2015-08-08 NOTE — Telephone Encounter (Signed)
I left pt a message stating that she needs to call the office and schedule a f/u appt with [name] so she can get meds refilled.  Thanks, Oswaldo Milian

## 2015-08-15 ENCOUNTER — Other Ambulatory Visit: Payer: Self-pay | Admitting: Physician Assistant

## 2015-09-13 ENCOUNTER — Encounter: Payer: Self-pay | Admitting: Physician Assistant

## 2015-09-13 ENCOUNTER — Ambulatory Visit (INDEPENDENT_AMBULATORY_CARE_PROVIDER_SITE_OTHER): Payer: 59 | Admitting: Physician Assistant

## 2015-09-13 VITALS — BP 111/61 | HR 85 | Ht 67.0 in | Wt 115.0 lb

## 2015-09-13 DIAGNOSIS — R232 Flushing: Secondary | ICD-10-CM | POA: Insufficient documentation

## 2015-09-13 DIAGNOSIS — F329 Major depressive disorder, single episode, unspecified: Secondary | ICD-10-CM | POA: Diagnosis not present

## 2015-09-13 DIAGNOSIS — G4700 Insomnia, unspecified: Secondary | ICD-10-CM

## 2015-09-13 DIAGNOSIS — L853 Xerosis cutis: Secondary | ICD-10-CM | POA: Diagnosis not present

## 2015-09-13 DIAGNOSIS — N951 Menopausal and female climacteric states: Secondary | ICD-10-CM | POA: Diagnosis not present

## 2015-09-13 DIAGNOSIS — F32A Depression, unspecified: Secondary | ICD-10-CM

## 2015-09-13 MED ORDER — VILAZODONE HCL 10 MG PO TABS: 10.0000 mg | ORAL_TABLET | Freq: Every day | ORAL | Status: DC

## 2015-09-13 MED ORDER — TRAZODONE HCL 50 MG PO TABS: ORAL_TABLET | ORAL | Status: DC

## 2015-09-13 NOTE — Progress Notes (Signed)
   Subjective:    Patient ID: Carol Rodgers, female    DOB: September 13, 1975, 40 y.o.   MRN: [id number]  HPI  Patient is a 40 year old female who presents to the clinic to follow-up on depression and insomnia. She been taking Effexor 37.5 mg daily. In the last 2 weeks increase to 75 mg daily. She has had increased hot flashes mostly at night. She does not feel like this is helped her mood. She feels no motivation to do anything. She did not want to go outside to play with her kids the other day. She denies any suicidal or homicidal thoughts. She denies any situations or issues that are causing increase of depression.  She does have some dry itchy skin under her wedding ring on the left hand. She has never had any type of allergic reactions. She is using some of her sons steroid cream with some benefit.    Review of Systems  All other systems reviewed and are negative.      Objective:   Physical Exam  Constitutional: She is oriented to person, place, and time. She appears well-developed and well-nourished.  HENT:  Head: Normocephalic and atraumatic.  Neck: Normal range of motion. Neck supple. No thyromegaly present.  Cardiovascular: Normal rate, regular rhythm and normal heart sounds.   Pulmonary/Chest: Effort normal and breath sounds normal. She has no wheezes.  Neurological: She is alert and oriented to person, place, and time.  Skin:  Dry erythematous scaling skin on left anterior ring finger.   Psychiatric: Her behavior is normal.  Flat affect.           Assessment & Plan:  MDD- PHQ-9 was 21. GAD-7 was 16. Taper off effexor. Start viibryd. Discussed side effects. Follow up in 6-8 weeks.   Dry skin- use triamcinolone. Discussed cetaphil cream to keep moisturized. Follow up as needed.   Hot flashes-could be a side effect of the Effexor. We are tapering off this medicine. Advised her to follow-up with her GYN when she runs labs in 10 days.  Insomnia- trazodone refilled.

## 2015-09-13 NOTE — Patient Instructions (Addendum)
b12 and vitamin d 1000mg .   Taper down effexor 37.5 for 7 days then stop.

## 2015-09-18 ENCOUNTER — Telehealth: Payer: Self-pay | Admitting: *Deleted

## 2015-09-18 MED ORDER — VILAZODONE HCL 10 MG PO TABS: ORAL_TABLET | ORAL | Status: DC

## 2015-09-18 NOTE — Telephone Encounter (Signed)
Patient called and states [facility] says her viibryd needs to be sent to [facility]

## 2015-09-24 ENCOUNTER — Telehealth: Payer: Self-pay | Admitting: *Deleted

## 2015-09-24 NOTE — Telephone Encounter (Signed)
PA faxed for viibryd

## 2015-09-25 NOTE — Telephone Encounter (Signed)
viibryd approved for one year through 09-18-2016 Left message on patients vm

## 2015-10-08 ENCOUNTER — Telehealth: Payer: Self-pay | Admitting: *Deleted

## 2015-10-08 NOTE — Telephone Encounter (Signed)
Pt left vm today wanting you to know that she's finally tapered to the viibryd 20mg  only.  She stated that she feel like her heart rate has increase and she feels "strange".  She also stated that she only took 10mg  today & wanted to know your thoughts on what she should do.  Please advise.

## 2015-10-09 NOTE — Telephone Encounter (Signed)
Stay on the 10mg  for now. Did her depression symptoms improve on viibryd?

## 2015-10-09 NOTE — Telephone Encounter (Signed)
Pt advised to continue with 10mg  of the Viibryd.  She is unsure if the increased HR & funny feelings are from the medication or from a viral sickness that she's been fighting.  I recommended that she go through the weekend & if no changes by Monday to call us back.

## 2015-10-25 ENCOUNTER — Telehealth: Payer: Self-pay | Admitting: *Deleted

## 2015-10-25 NOTE — Telephone Encounter (Signed)
Pt left vm stating that she is still feeling bad on the 10mg  Viibryd.  There was a note from earlier in the month where you advised her to just stay on 10mg  instead of the 20mg  written.  She wants to know what she needs to do from here.  She still has some Effexor left as well.  Would you like for her to come in? Please advise.

## 2015-10-25 NOTE — Telephone Encounter (Signed)
LMOM notifying pt to schedule a f/u appt.

## 2015-10-25 NOTE — Telephone Encounter (Signed)
If she feel slike she felt better on effexor can start 37.5 again and stop viibryd.

## 2015-10-28 ENCOUNTER — Ambulatory Visit (INDEPENDENT_AMBULATORY_CARE_PROVIDER_SITE_OTHER): Payer: 59 | Admitting: Physician Assistant

## 2015-10-28 ENCOUNTER — Encounter: Payer: Self-pay | Admitting: Physician Assistant

## 2015-10-28 VITALS — BP 106/84 | HR 111 | Ht 67.0 in | Wt 117.0 lb

## 2015-10-28 DIAGNOSIS — F329 Major depressive disorder, single episode, unspecified: Secondary | ICD-10-CM | POA: Diagnosis not present

## 2015-10-28 DIAGNOSIS — T887XXA Unspecified adverse effect of drug or medicament, initial encounter: Secondary | ICD-10-CM | POA: Diagnosis not present

## 2015-10-28 DIAGNOSIS — F32A Depression, unspecified: Secondary | ICD-10-CM

## 2015-10-28 DIAGNOSIS — T50905A Adverse effect of unspecified drugs, medicaments and biological substances, initial encounter: Secondary | ICD-10-CM

## 2015-10-28 MED ORDER — DULOXETINE HCL 20 MG PO CPEP: 20.0000 mg | ORAL_CAPSULE | Freq: Every day | ORAL | Status: DC

## 2015-10-28 NOTE — Progress Notes (Addendum)
   Subjective:    Patient ID: Carol Rodgers, female    DOB: 15-Jun-1976, 40 y.o.   MRN: [id number]  HPI Patient is here today complaining of depression medication side effects. She was started on Viibryd 09/13/15 and states she has felt very agitated since starting it. She stopped taking Viibryd 10/24/15 and began taking Effexor pills that she had left from a previous prescription. She has tried several depression medications in the past, but all have caused various side effects. Lexapro made her feel "Zombie-like." Effexor caused severe night sweats, but she does state that it greatly improved her depression. She reports taking Wellbutrin and Zoloft in the past during college, and cannot recall if these caused side effects.    Review of Systems  All other systems reviewed and are negative.      Objective:   Physical Exam  Constitutional: She appears well-developed and well-nourished.  HENT:  Head: Normocephalic and atraumatic.  Eyes: Conjunctivae are normal. Right eye exhibits no discharge. Left eye exhibits no discharge.  Neck: No tracheal deviation present.  Cardiovascular: Normal rate, regular rhythm and normal heart sounds.   Pulmonary/Chest: Effort normal and breath sounds normal.          Assessment & Plan:  1. MDD- Patient presents today with medication side effects from [place]. Because she reports improvement in her depression symptoms with Effexor in the past, we will try Cymbalta 20 MG at this time with hopes that she has the same benefits as Effexor, but does not have the same side effects. Follow up in 4-6 weeks or sooner as needed.

## 2015-11-13 ENCOUNTER — Other Ambulatory Visit: Payer: Self-pay | Admitting: Physician Assistant

## 2015-11-15 ENCOUNTER — Encounter: Payer: Self-pay | Admitting: Physician Assistant

## 2015-11-15 ENCOUNTER — Other Ambulatory Visit: Payer: Self-pay | Admitting: Physician Assistant

## 2015-11-15 ENCOUNTER — Ambulatory Visit (INDEPENDENT_AMBULATORY_CARE_PROVIDER_SITE_OTHER): Payer: 59 | Admitting: Physician Assistant

## 2015-11-15 VITALS — BP 109/70 | HR 77 | Temp 98.3°F | Ht 67.0 in | Wt 120.0 lb

## 2015-11-15 DIAGNOSIS — F32A Depression, unspecified: Secondary | ICD-10-CM

## 2015-11-15 DIAGNOSIS — J029 Acute pharyngitis, unspecified: Secondary | ICD-10-CM

## 2015-11-15 DIAGNOSIS — F329 Major depressive disorder, single episode, unspecified: Secondary | ICD-10-CM

## 2015-11-15 LAB — POCT RAPID STREP A (OFFICE): RAPID STREP A SCREEN: NEGATIVE

## 2015-11-15 MED ORDER — DULOXETINE HCL 30 MG PO CPEP: 30.0000 mg | ORAL_CAPSULE | Freq: Every day | ORAL | Status: DC

## 2015-11-15 NOTE — Progress Notes (Signed)
   Subjective:    Patient ID: Carol Rodgers, female    DOB: October 19, 1975, 40 y.o.   MRN: [id number]  HPI Pt presents to the clinic with ST for 1 day. She denies any cough, fever, chills, sinus pressure, SOB or wheezing. She has not tried anything to make better. She wants to make sure not strep before weekend. Her daughter had strep 2 weeks ago and son has a double ear infection.   She is doing great on cymbalta. No side effects. On for 3 weeks. Still desiring a little more improvement in down symptoms.    Review of Systems See HPI.     Objective:   Physical Exam  Constitutional: She is oriented to person, place, and time. She appears well-developed and well-nourished.  HENT:  Head: Normocephalic and atraumatic.  Right Ear: External ear normal.  Left Ear: External ear normal.  Nose: Nose normal.  Mouth/Throat: No oropharyngeal exudate.  TM's clear bilaterally.  Oropharynx erythematous. No tonsils.  No exudate.  Bilateral nares swollen and red.  No tenderness over sinuses.   Eyes: Conjunctivae are normal. Right eye exhibits no discharge. Left eye exhibits no discharge.  Neck: Normal range of motion. Neck supple.  Cardiovascular: Normal rate, regular rhythm and normal heart sounds.   Pulmonary/Chest: Effort normal and breath sounds normal. She has no wheezes.  Lymphadenopathy:    She has no cervical adenopathy.  Neurological: She is alert and oriented to person, place, and time.  Psychiatric: She has a normal mood and affect. Her behavior is normal.          Assessment & Plan:  Acute pharyngitis- rapid strep negative. Discussed symptomatic care with salt water gargles, ibuprofen, lozengers, and sudafed. If not improving by Monday will consider abx. HO given.   Depression- doing much better on cymbalta with no side effects. Increased to 30mg  daily. Follow up at the end of this rx.

## 2015-11-15 NOTE — Patient Instructions (Signed)

## 2015-12-10 ENCOUNTER — Telehealth: Payer: Self-pay | Admitting: *Deleted

## 2015-12-10 NOTE — Telephone Encounter (Signed)
Initiated a PA back on 11/15/15 for trazodone as was generated by the patient's pharmacy. Checked on the status of PA in covermymeds and it states that patient needs to use a mail order pharm. She has exceeded the total amount of retail fills. Called and left a message on the patients voicemail to make her aware of this

## 2015-12-26 ENCOUNTER — Other Ambulatory Visit: Payer: Self-pay | Admitting: Physician Assistant

## 2016-01-20 ENCOUNTER — Other Ambulatory Visit: Payer: Self-pay | Admitting: Physician Assistant

## 2016-01-30 ENCOUNTER — Telehealth: Payer: Self-pay

## 2016-01-30 NOTE — Telephone Encounter (Signed)
At 30 mg she can simply stop it and withdrawal symptoms should resolve very quickly. Likely a few more days.

## 2016-01-30 NOTE — Telephone Encounter (Signed)
Carol Rodgers states she stopped taking Cymbalta 30 mg 3-4 days ago. She has had dizziness, nausea and is unable to drive because of the dizziness. She says she could handle the withdraw symptoms if it is going to last a couple of days. If it lasts for a couple of weeks she needs to know how to step down off medication. Her insurance will only pay for a 90 day supply. She would need a refill.

## 2016-01-31 NOTE — Telephone Encounter (Signed)
Left message advising of recommendations.  

## 2016-02-04 NOTE — Telephone Encounter (Signed)
Left a message asking patient to return call if she need any additional help with the Cymbalta.

## 2016-04-22 ENCOUNTER — Encounter: Payer: Self-pay | Admitting: Physician Assistant

## 2016-04-22 ENCOUNTER — Ambulatory Visit (INDEPENDENT_AMBULATORY_CARE_PROVIDER_SITE_OTHER): Payer: 59 | Admitting: Physician Assistant

## 2016-04-22 VITALS — BP 128/73 | HR 89

## 2016-04-22 DIAGNOSIS — F332 Major depressive disorder, recurrent severe without psychotic features: Secondary | ICD-10-CM

## 2016-04-22 DIAGNOSIS — F411 Generalized anxiety disorder: Secondary | ICD-10-CM | POA: Diagnosis not present

## 2016-04-22 DIAGNOSIS — Z79899 Other long term (current) drug therapy: Secondary | ICD-10-CM | POA: Diagnosis not present

## 2016-04-22 MED ORDER — DULOXETINE HCL 30 MG PO CPEP: 30.0000 mg | ORAL_CAPSULE | Freq: Every day | ORAL | 5 refills | Status: DC

## 2016-04-22 NOTE — Progress Notes (Signed)
   Subjective:    Patient ID: Carol Rodgers, female    DOB: September 30, 1975, 40 y.o.   MRN: [id number]  HPI Pt is a 40 yo female who presents to follow up on anxiety and depression. She has tried numerous anti-depressants and had side effects or they didn't work. cymbalta worked but she stopped because she wanted to try to do it without medication. She stopped all the sudden and had withdrawal symptoms. She denies any suicidal or homicidal thoughts. She is much more depressed and anxious. She would like to restart medication.    Review of Systems  All other systems reviewed and are negative.      Objective:   Physical Exam  Constitutional: She is oriented to person, place, and time. She appears well-developed and well-nourished.  HENT:  Head: Normocephalic and atraumatic.  Cardiovascular: Normal rate, regular rhythm and normal heart sounds.   Pulmonary/Chest: Effort normal and breath sounds normal.  Neurological: She is alert and oriented to person, place, and time.  Psychiatric: She has a normal mood and affect. Her behavior is normal.          Assessment & Plan:  Marland KitchenMarland KitchenDiagnoses and all orders for this visit:  Severe episode of recurrent major depressive disorder, without psychotic features ([place]) -     DULoxetine (CYMBALTA) 30 MG capsule; Take 1 capsule (30 mg total) by mouth daily.  Medication management -     COMPLETE METABOLIC PANEL WITH GFR  Anxiety state -     DULoxetine (CYMBALTA) 30 MG capsule; Take 1 capsule (30 mg total) by mouth daily.   Restarted medication. Follow up in 3 months. cmp ordered to monitor medication side effects.

## 2016-06-19 ENCOUNTER — Other Ambulatory Visit: Payer: Self-pay | Admitting: *Deleted

## 2016-06-19 DIAGNOSIS — F411 Generalized anxiety disorder: Secondary | ICD-10-CM

## 2016-06-19 DIAGNOSIS — F332 Major depressive disorder, recurrent severe without psychotic features: Secondary | ICD-10-CM

## 2016-06-19 MED ORDER — DULOXETINE HCL 30 MG PO CPEP: 30.0000 mg | ORAL_CAPSULE | Freq: Every day | ORAL | 0 refills | Status: DC

## 2016-08-16 ENCOUNTER — Other Ambulatory Visit: Payer: Self-pay | Admitting: Physician Assistant

## 2016-08-16 DIAGNOSIS — F332 Major depressive disorder, recurrent severe without psychotic features: Secondary | ICD-10-CM

## 2016-08-16 DIAGNOSIS — F411 Generalized anxiety disorder: Secondary | ICD-10-CM

## 2016-10-30 ENCOUNTER — Encounter: Payer: Self-pay | Admitting: Physician Assistant

## 2016-10-30 ENCOUNTER — Telehealth: Payer: Self-pay

## 2016-10-30 ENCOUNTER — Ambulatory Visit (INDEPENDENT_AMBULATORY_CARE_PROVIDER_SITE_OTHER): Payer: 59 | Admitting: Physician Assistant

## 2016-10-30 VITALS — BP 115/70 | HR 99 | Ht 67.0 in | Wt 128.0 lb

## 2016-10-30 DIAGNOSIS — F411 Generalized anxiety disorder: Secondary | ICD-10-CM | POA: Diagnosis not present

## 2016-10-30 DIAGNOSIS — F332 Major depressive disorder, recurrent severe without psychotic features: Secondary | ICD-10-CM

## 2016-10-30 DIAGNOSIS — R5383 Other fatigue: Secondary | ICD-10-CM

## 2016-10-30 MED ORDER — BUPROPION HCL ER (XL) 150 MG PO TB24: 150.0000 mg | ORAL_TABLET | Freq: Every day | ORAL | 1 refills | Status: DC

## 2016-10-30 MED ORDER — DULOXETINE HCL 30 MG PO CPEP: 30.0000 mg | ORAL_CAPSULE | Freq: Every day | ORAL | 1 refills | Status: DC

## 2016-10-30 NOTE — Telephone Encounter (Signed)
Please send medication refill for Dul[facility] to [facility] , pt will not receive the medication from the mail order pharmacy for a week and she's almost out.

## 2016-10-30 NOTE — Telephone Encounter (Signed)
Ok done

## 2016-10-31 LAB — CBC
HCT: 39.8 % (ref 35.0–45.0)
HEMOGLOBIN: 13.5 g/dL (ref 11.7–15.5)
MCH: 32 pg (ref 27.0–33.0)
MCHC: 33.9 g/dL (ref 32.0–36.0)
MCV: 94.3 fL (ref 80.0–100.0)
MPV: 11 fL (ref 7.5–12.5)
PLATELETS: 205 10*3/uL (ref 140–400)
RBC: 4.22 MIL/uL (ref 3.80–5.10)
RDW: 12.8 % (ref 11.0–15.0)
WBC: 4.7 10*3/uL (ref 3.8–10.8)

## 2016-10-31 LAB — COMPREHENSIVE METABOLIC PANEL
ALBUMIN: 4 g/dL (ref 3.6–5.1)
ALK PHOS: 33 U/L (ref 33–115)
ALT: 11 U/L (ref 6–29)
AST: 15 U/L (ref 10–30)
BILIRUBIN TOTAL: 0.6 mg/dL (ref 0.2–1.2)
BUN: 9 mg/dL (ref 7–25)
CALCIUM: 8.8 mg/dL (ref 8.6–10.2)
CO2: 26 mmol/L (ref 20–31)
Chloride: 107 mmol/L (ref 98–110)
Creat: 1.08 mg/dL (ref 0.50–1.10)
Glucose, Bld: 84 mg/dL (ref 65–99)
Potassium: 4 mmol/L (ref 3.5–5.3)
SODIUM: 141 mmol/L (ref 135–146)
TOTAL PROTEIN: 6.3 g/dL (ref 6.1–8.1)

## 2016-10-31 LAB — SEDIMENTATION RATE: SED RATE: 4 mm/h (ref 0–20)

## 2016-10-31 LAB — VITAMIN D 25 HYDROXY (VIT D DEFICIENCY, FRACTURES): Vit D, 25-Hydroxy: 21 ng/mL — ABNORMAL LOW (ref 30–100)

## 2016-10-31 LAB — TSH: TSH: 1.95 mIU/L

## 2016-10-31 LAB — FERRITIN: FERRITIN: 11 ng/mL (ref 10–154)

## 2016-10-31 LAB — VITAMIN B12: Vitamin B-12: 302 pg/mL (ref 200–1100)

## 2016-11-01 ENCOUNTER — Encounter: Payer: Self-pay | Admitting: Physician Assistant

## 2016-11-01 NOTE — Progress Notes (Signed)
   Subjective:    Patient ID: [name], female    DOB: 1975-11-07, 41 y.o.   MRN: [id number]  HPI Pt is a 41 yo female who presents to the clinic to follow up on severe depression and anxiety. She is doing ok on cymbalta. Her depression and anxiety seem to be better but she has no energy. Which turns into no motivation to do anything. She just feels drained all the time. She would love to take at least 2 naps a day.    Review of Systems  All other systems reviewed and are negative.      Objective:   Physical Exam  Constitutional: She is oriented to person, place, and time. She appears well-developed and well-nourished.  HENT:  Head: Normocephalic and atraumatic.  Neck: No thyromegaly present.  Cardiovascular: Normal rate, regular rhythm and normal heart sounds.   Pulmonary/Chest: Effort normal and breath sounds normal.  Neurological: She is alert and oriented to person, place, and time.  Psychiatric: She has a normal mood and affect. Her behavior is normal.          Assessment & Plan:  Marland KitchenMarland KitchenNelwyn was seen today for depression and anxiety.  Diagnoses and all orders for this visit:  No energy -     CBC -     Comprehensive metabolic panel -     C-reactive protein -     Ferritin -     Sedimentation rate -     TSH -     Vitamin B12 -     Vit D  25 hydroxy (rtn osteoporosis monitoring)  Severe episode of recurrent major depressive disorder, without psychotic features ([place]) -     Discontinue: DULoxetine (CYMBALTA) 30 MG capsule; Take 1 capsule (30 mg total) by mouth daily. -     DULoxetine (CYMBALTA) 30 MG capsule; Take 1 capsule (30 mg total) by mouth daily.  Anxiety state -     Discontinue: DULoxetine (CYMBALTA) 30 MG capsule; Take 1 capsule (30 mg total) by mouth daily. -     DULoxetine (CYMBALTA) 30 MG capsule; Take 1 capsule (30 mg total) by mouth daily.  Other orders -     buPROPion (WELLBUTRIN XL) 150 MG 24 hr tablet; Take 1 tablet (150 mg total) by mouth  daily.  .. Depression screen [facility] 2/9 10/30/2016 10/13/2013  Decreased Interest 2 3  Down, Depressed, Hopeless 1 2  PHQ - 2 Score 3 5  Altered sleeping 3 3  Tired, decreased energy 3 3  Change in appetite 0 0  Feeling bad or failure about yourself  1 2  Trouble concentrating 0 3  Moving slowly or fidgety/restless 1 0  Suicidal thoughts 0 0  PHQ-9 Score 11 16   .Marland Kitchen GAD 7 : Generalized Anxiety Score 10/30/2016  Nervous, Anxious, on Edge 2  Control/stop worrying 2  Worry too much - different things 2  Trouble relaxing 1  Restless 1  Easily annoyed or irritable 3  Afraid - awful might happen 2  Total GAD 7 Score 13     Added wellbutrin. Discussed side effects. Follow up in 6 weeks.

## 2016-11-02 LAB — C-REACTIVE PROTEIN: CRP: 0.6 mg/L (ref <8.0)

## 2016-12-26 ENCOUNTER — Other Ambulatory Visit: Payer: Self-pay | Admitting: Physician Assistant

## 2017-01-20 ENCOUNTER — Other Ambulatory Visit: Payer: Self-pay | Admitting: *Deleted

## 2017-01-20 MED ORDER — BUPROPION HCL ER (XL) 150 MG PO TB24: ORAL_TABLET | ORAL | 0 refills | Status: DC

## 2017-01-24 ENCOUNTER — Other Ambulatory Visit: Payer: Self-pay | Admitting: Physician Assistant

## 2017-03-19 ENCOUNTER — Other Ambulatory Visit: Payer: Self-pay | Admitting: Physician Assistant

## 2017-03-19 DIAGNOSIS — F411 Generalized anxiety disorder: Secondary | ICD-10-CM

## 2017-03-19 DIAGNOSIS — F332 Major depressive disorder, recurrent severe without psychotic features: Secondary | ICD-10-CM

## 2017-03-24 ENCOUNTER — Other Ambulatory Visit: Payer: Self-pay | Admitting: Physician Assistant

## 2017-05-10 ENCOUNTER — Other Ambulatory Visit: Payer: Self-pay | Admitting: Physician Assistant

## 2017-05-10 ENCOUNTER — Other Ambulatory Visit: Payer: Self-pay

## 2017-05-10 MED ORDER — BUPROPION HCL ER (XL) 150 MG PO TB24: 150.0000 mg | ORAL_TABLET | Freq: Every day | ORAL | 0 refills | Status: DC

## 2017-05-10 NOTE — Progress Notes (Signed)
Left message advising patient to schedule a follow up appointment.

## 2017-05-31 ENCOUNTER — Encounter: Payer: Self-pay | Admitting: Physician Assistant

## 2017-05-31 ENCOUNTER — Ambulatory Visit: Payer: 59 | Admitting: Physician Assistant

## 2017-05-31 VITALS — BP 113/74 | HR 87 | Ht 67.0 in | Wt 135.0 lb

## 2017-05-31 DIAGNOSIS — R7989 Other specified abnormal findings of blood chemistry: Secondary | ICD-10-CM

## 2017-05-31 DIAGNOSIS — F332 Major depressive disorder, recurrent severe without psychotic features: Secondary | ICD-10-CM | POA: Diagnosis not present

## 2017-05-31 DIAGNOSIS — F411 Generalized anxiety disorder: Secondary | ICD-10-CM

## 2017-05-31 DIAGNOSIS — R5383 Other fatigue: Secondary | ICD-10-CM | POA: Diagnosis not present

## 2017-05-31 DIAGNOSIS — F5101 Primary insomnia: Secondary | ICD-10-CM | POA: Diagnosis not present

## 2017-05-31 DIAGNOSIS — E538 Deficiency of other specified B group vitamins: Secondary | ICD-10-CM

## 2017-05-31 MED ORDER — BUPROPION HCL ER (XL) 150 MG PO TB24: 150.0000 mg | ORAL_TABLET | Freq: Every day | ORAL | 3 refills | Status: DC

## 2017-05-31 MED ORDER — DULOXETINE HCL 30 MG PO CPEP: 30.0000 mg | ORAL_CAPSULE | Freq: Every day | ORAL | 3 refills | Status: DC

## 2017-05-31 MED ORDER — TRAZODONE HCL 50 MG PO TABS: ORAL_TABLET | ORAL | 3 refills | Status: DC

## 2017-05-31 MED ORDER — CYANOCOBALAMIN 1000 MCG/ML IJ SOLN
1000.0000 ug | Freq: Once | INTRAMUSCULAR | Status: AC
  Administered 2017-05-31: 1000 ug via INTRAMUSCULAR

## 2017-05-31 NOTE — Patient Instructions (Signed)
Follow up in 6 months Recheck b12 levels in 2 months after 2nd shot.

## 2017-06-01 ENCOUNTER — Encounter: Payer: Self-pay | Admitting: Physician Assistant

## 2017-06-01 NOTE — Progress Notes (Signed)
Subjective:    Patient ID: Carol Rodgers, female    DOB: 12/19/1975, 41 y.o.   MRN: [id number]  HPI Patient is a 41 year old female who presents to the clinic for insomnia, depression, anxiety medication follow-up.  Patient is currently on Cymbalta and Wellbutrin for depression and anxiety.  She actually feels like she is doing the best she has in a while.  She denies any suicidal or homicidal thoughts.  Her biggest complaint is her fatigue.  She has complained about this for the last few years.  She does admit she is not sleeping great.  Trazodone does help but she does not want to take it every night.  At her last labs we worked up fatigue her B12 was on the low normal.  I have told her to take oral B12.  She is really never done this.  She wonders if the b12 shot would help.  .. Active Ambulatory Problems    Diagnosis Date Noted  . Depression 09/15/2013  . Sleeping difficulties 09/15/2013  . Anxiety state 09/15/2013  . Fatigue 10/13/2013  . Insomnia 04/05/2014  . Hot flashes 09/13/2015  . Dry skin 09/13/2015  . No energy 05/31/2017   Resolved Ambulatory Problems    Diagnosis Date Noted  . No Resolved Ambulatory Problems   Past Medical History:  Diagnosis Date  . No pertinent past medical history   . PONV (postoperative nausea and vomiting)        Review of Systems  All other systems reviewed and are negative.      Objective:   Physical Exam  Constitutional: She is oriented to person, place, and time. She appears well-developed and well-nourished.  HENT:  Head: Normocephalic and atraumatic.  Cardiovascular: Normal rate, regular rhythm and normal heart sounds.  Pulmonary/Chest: Effort normal and breath sounds normal.  Neurological: She is alert and oriented to person, place, and time.  Psychiatric: She has a normal mood and affect. Her behavior is normal.          Assessment & Plan:  .Marland KitchenMarland KitchenDiagnoses and all orders for this visit:  Severe episode of  recurrent major depressive disorder, without psychotic features ([place]) -     DULoxetine (CYMBALTA) 30 MG capsule; Take 1 capsule (30 mg total) by mouth daily. -     buPROPion (WELLBUTRIN XL) 150 MG 24 hr tablet; Take 1 tablet (150 mg total) by mouth daily.  Anxiety state -     DULoxetine (CYMBALTA) 30 MG capsule; Take 1 capsule (30 mg total) by mouth daily.  Primary insomnia -     traZODone (DESYREL) 50 MG tablet; TAKE 1/2 TO 1 TABLET BY MOUTH EVERY NIGHT AT BEDTIME AS NEEDED FOR SLEEP  No energy -     B12  Low serum vitamin B12 -     B12 -     cyanocobalamin ((VITAMIN B-12)) injection 1,000 mcg   .Marland Kitchen Depression screen [facility] 2/9 05/31/2017 10/30/2016 10/13/2013  Decreased Interest 1 2 3   Down, Depressed, Hopeless 1 1 2   PHQ - 2 Score 2 3 5   Altered sleeping 3 3 3   Tired, decreased energy 2 3 3   Change in appetite 1 0 0  Feeling bad or failure about yourself  1 1 2   Trouble concentrating 0 0 3  Moving slowly or fidgety/restless 0 1 0  Suicidal thoughts 0 0 0  PHQ-9 Score 9 11 16    .Marland Kitchen GAD 7 : Generalized Anxiety Score 05/31/2017 10/30/2016  Nervous, Anxious, on Edge 1 2  Control/stop worrying 1 2  Worry too much - different things 1 2  Trouble relaxing 0 1  Restless 0 1  Easily annoyed or irritable 1 3  Afraid - awful might happen 0 2  Total GAD 7 Score 4 13    Refilled cymbalta and wellbutrin.   Ultimately unclear etiology of fatigue.  It could be because she is not sleeping well.  I strongly encourage patient to take trazodone daily.  If this helps her sleep and increased improves her energy this can be very beneficial to her.  Her depression numbers are pretty good.  Certainly there still could be a level of depression causing no motivation and fatigue.  Her B12 level when we checked her labs last were low normal.  She has not started the oral supplementation.  I will try a shot of B12 today and see if that helps her at all with energy.  If this does we will recheck her B12 levels  after 2 shots and consider how we could supplement this.  If there is no improvement we certainly have to consider the possibility of chronic fatigue syndrome.  Follow up in 6 months.

## 2017-09-23 ENCOUNTER — Ambulatory Visit (INDEPENDENT_AMBULATORY_CARE_PROVIDER_SITE_OTHER): Payer: BLUE CROSS/BLUE SHIELD | Admitting: Sports Medicine

## 2017-09-23 ENCOUNTER — Encounter: Payer: Self-pay | Admitting: Sports Medicine

## 2017-09-23 DIAGNOSIS — J0100 Acute maxillary sinusitis, unspecified: Secondary | ICD-10-CM | POA: Diagnosis not present

## 2017-09-23 MED ORDER — PREDNISONE 50 MG PO TABS: 50.0000 mg | ORAL_TABLET | Freq: Every day | ORAL | 0 refills | Status: DC

## 2017-09-23 MED ORDER — AMOXICILLIN-POT CLAVULANATE 875-125 MG PO TABS: 1.0000 | ORAL_TABLET | Freq: Two times a day (BID) | ORAL | 0 refills | Status: AC

## 2017-09-23 NOTE — Patient Instructions (Signed)

## 2017-09-23 NOTE — Assessment & Plan Note (Signed)
Prednisone, Augmentin.

## 2017-09-23 NOTE — Progress Notes (Signed)
Subjective:    CC: Feeling sick  HPI: For the past 2 weeks this pleasant 42 year old female has had increasing pain and pressure behind her maxillary sinuses with radiation to the right ears, cough productive of greenish sputum, no constitutional symptoms, no GI symptoms, no rash, no chest pain or shortness of breath.  I reviewed the past medical history, family history, social history, surgical history, and allergies today and no changes were needed.  Please see the problem list section below in epic for further details.  Past Medical History: Past Medical History:  Diagnosis Date  . No pertinent past medical history   . PONV (postoperative nausea and vomiting)    Past Surgical History: Past Surgical History:  Procedure Laterality Date  . CESAREAN SECTION    . CESAREAN SECTION  06/07/2012   Procedure: CESAREAN SECTION;  Surgeon: Marylynn Pearson, MD;  Location: [facility];  Service: Obstetrics;  Laterality: N/A;  Repeat edc 06/10/12  . NASAL SINUS SURGERY    . TONSILLECTOMY     Social History: Social History   Socioeconomic History  . Marital status: Married    Spouse name: Not on file  . Number of children: Not on file  . Years of education: Not on file  . Highest education level: Not on file  Occupational History  . Not on file  Social Needs  . Financial resource strain: Not on file  . Food insecurity:    Worry: Not on file    Inability: Not on file  . Transportation needs:    Medical: Not on file    Non-medical: Not on file  Tobacco Use  . Smoking status: Never Smoker  . Smokeless tobacco: Never Used  Substance and Sexual Activity  . Alcohol use: Yes  . Drug use: No  . Sexual activity: Yes  Lifestyle  . Physical activity:    Days per week: Not on file    Minutes per session: Not on file  . Stress: Not on file  Relationships  . Social connections:    Talks on phone: Not on file    Gets together: Not on file    Attends religious service: Not on file   Active member of club or organization: Not on file    Attends meetings of clubs or organizations: Not on file    Relationship status: Not on file  Other Topics Concern  . Not on file  Social History Narrative  . Not on file   Family History: Family History  Problem Relation Age of Onset  . Cancer Maternal Grandfather   . Hypertension Paternal Grandfather   . Heart attack Paternal Grandfather    Allergies: Allergies  Allergen Reactions  . Viibryd [Vilazodone Hcl] Other (See Comments)    Severe anger   Medications: See med rec.  Review of Systems: No fevers, chills, night sweats, weight loss, chest pain, or shortness of breath.   Objective:    General: Well Developed, well nourished, and in no acute distress.  Neuro: Alert and oriented x3, extra-ocular muscles intact, sensation grossly intact.  HEENT: Normocephalic, atraumatic, pupils equal round reactive to light, neck supple, no masses, no lymphadenopathy, thyroid nonpalpable.  Oropharynx, ear canals unremarkable, boggy and slightly erythematous turbinates in the nasopharynx with tenderness over the maxillary sinuses. Skin: Warm and dry, no rashes. Cardiac: Regular rate and rhythm, no murmurs rubs or gallops, no lower extremity edema.  Respiratory: Clear to auscultation bilaterally. Not using accessory muscles, speaking in full sentences.  Impression and Recommendations:  Acute non-recurrent maxillary sinusitis Prednisone, Augmentin. ___________________________________________ Gwen Her. Dianah Field, M.D., ABFM., CAQSM. Primary Care and [place] Instructor of [place] [facility]

## 2018-03-22 ENCOUNTER — Encounter: Payer: Self-pay | Admitting: Physician Assistant

## 2018-03-22 ENCOUNTER — Ambulatory Visit (INDEPENDENT_AMBULATORY_CARE_PROVIDER_SITE_OTHER): Payer: BLUE CROSS/BLUE SHIELD | Admitting: Physician Assistant

## 2018-03-22 VITALS — BP 120/80 | HR 72 | Ht 67.0 in | Wt 123.0 lb

## 2018-03-22 DIAGNOSIS — Z13220 Encounter for screening for lipoid disorders: Secondary | ICD-10-CM | POA: Diagnosis not present

## 2018-03-22 DIAGNOSIS — Z131 Encounter for screening for diabetes mellitus: Secondary | ICD-10-CM

## 2018-03-22 DIAGNOSIS — F332 Major depressive disorder, recurrent severe without psychotic features: Secondary | ICD-10-CM

## 2018-03-22 DIAGNOSIS — R5383 Other fatigue: Secondary | ICD-10-CM

## 2018-03-22 DIAGNOSIS — F5101 Primary insomnia: Secondary | ICD-10-CM

## 2018-03-22 DIAGNOSIS — F411 Generalized anxiety disorder: Secondary | ICD-10-CM

## 2018-03-22 DIAGNOSIS — L659 Nonscarring hair loss, unspecified: Secondary | ICD-10-CM | POA: Diagnosis not present

## 2018-03-22 MED ORDER — TRAZODONE HCL 50 MG PO TABS
ORAL_TABLET | ORAL | 3 refills | Status: DC
Start: 2018-03-22 — End: 2018-12-02

## 2018-03-22 MED ORDER — DULOXETINE HCL 30 MG PO CPEP: 30.0000 mg | ORAL_CAPSULE | Freq: Every day | ORAL | 3 refills | Status: DC

## 2018-03-22 MED ORDER — BUPROPION HCL ER (XL) 150 MG PO TB24: 150.0000 mg | ORAL_TABLET | Freq: Every day | ORAL | 3 refills | Status: DC

## 2018-03-22 MED ORDER — CYANOCOBALAMIN 1000 MCG/ML IJ SOLN
1000.0000 ug | Freq: Once | INTRAMUSCULAR | Status: AC
  Administered 2018-03-22: 1000 ug via INTRAMUSCULAR

## 2018-03-22 NOTE — Progress Notes (Signed)
Subjective:     Patient ID: Carol Rodgers, female   DOB: 10-14-1975, 42 y.o.   MRN: [id number]  HPI Patient is a 42 yo female who presents today for an insomnia, depression, and anxiety medication follow-up.   She is currently taking Wellbutrin and Cymbalta for her depression and anxiety and taking trazodone for insomnia. She is taking her medication every day and has been having no problems with them. She states her mood is good and she reports no problems with anxiety or stress. She has been taking trazodone every night and sleeping well, except she reports some trouble sleeping which she thinks may be related to the start of her period.   Patient complains of feeling tired. She remembers that she received a B12 injection and this helped with her energy. She would like to get another B12 injection today. She states she had forgotten to get her B12 levels checked 6 months ago but will go get them checked after this visit.  Patient also complains of losing hair. She states that she has been noticing hair loss for about a month. She says she notices a lot of hair on her brush after brushing it and has also noticed that it seems like her hairline is thinning at the front. She denies any recent stress, changes in diet, or new hair products.   .. Active Ambulatory Problems    Diagnosis Date Noted  . Depression 09/15/2013  . Sleeping difficulties 09/15/2013  . Anxiety state 09/15/2013  . Fatigue 10/13/2013  . Insomnia 04/05/2014  . Hot flashes 09/13/2015  . Dry skin 09/13/2015  . No energy 05/31/2017  . Acute non-recurrent maxillary sinusitis 09/23/2017  . Hair loss 03/22/2018   Resolved Ambulatory Problems    Diagnosis Date Noted  . No Resolved Ambulatory Problems   Past Medical History:  Diagnosis Date  . No pertinent past medical history   . PONV (postoperative nausea and vomiting)       Review of Systems  All other systems reviewed and are negative.      Objective:    Physical Exam  Constitutional: She is oriented to person, place, and time. She appears well-developed and well-nourished.  HENT:  Head: Normocephalic and atraumatic.  Cardiovascular: Normal rate and regular rhythm.  Pulmonary/Chest: Effort normal and breath sounds normal.  Neurological: She is alert and oriented to person, place, and time.  Psychiatric: She has a normal mood and affect. Her behavior is normal.   Hair pull test-- 2-4 hairs. Some thinning hair noted at the front of scalp. No patches of hair loss noted.     Assessment:     Marland KitchenMarland KitchenDiagnoses and all orders for this visit:  Severe episode of recurrent major depressive disorder, without psychotic features ([place]) -     buPROPion (WELLBUTRIN XL) 150 MG 24 hr tablet; Take 1 tablet (150 mg total) by mouth daily. -     DULoxetine (CYMBALTA) 30 MG capsule; Take 1 capsule (30 mg total) by mouth daily.  Hair loss -     TSH -     B12 and Folate Panel -     Vitamin D 1,25 dihydroxy -     CBC with Differential/Platelet -     Testosterone  Anxiety state -     DULoxetine (CYMBALTA) 30 MG capsule; Take 1 capsule (30 mg total) by mouth daily.  Primary insomnia -     traZODone (DESYREL) 50 MG tablet; TAKE 1/2 TO 1 TABLET BY MOUTH EVERY NIGHT AT BEDTIME  AS NEEDED FOR SLEEP  No energy -     TSH -     B12 and Folate Panel -     Vitamin D 1,25 dihydroxy -     CBC with Differential/Platelet -     Testosterone -     cyanocobalamin ((VITAMIN B-12)) injection 1,000 mcg  Screening for lipid disorders -     Lipid Panel w/reflex Direct LDL  Screening for diabetes mellitus -     COMPLETE METABOLIC PANEL WITH GFR       Plan:     .Marland Kitchen Depression screen [facility] 2/9 03/22/2018 05/31/2017 10/30/2016 10/13/2013  Decreased Interest 0 1 2 3   Down, Depressed, Hopeless 0 1 1 2   PHQ - 2 Score 0 2 3 5   Altered sleeping 1 3 3 3   Tired, decreased energy 2 2 3 3   Change in appetite 0 1 0 0  Feeling bad or failure about yourself  0 1 1 2   Trouble  concentrating 0 0 0 3  Moving slowly or fidgety/restless 0 0 1 0  Suicidal thoughts 0 0 0 0  PHQ-9 Score 3 9 11 16   Difficult doing work/chores Not difficult at all - - -   Patient's anxiety and depression seems to be controlled.  I did go ahead and refill these medications.  Trazodone was refilled for sleep.  Continue to discuss good sleep hygiene.  All her children are in school during the day.  We talked about hobbies and things that she could do during the day to be more productive and help with anxiety and depression.  Patient continues to battle no energy.  With her depression being controlled we will recheck some labs today.  She did admit to feeling better after her B12 shot the last time she was here.  Okayed B12 1000 MCG's given today.  She does complain of hair loss.  I do not see medications on her med list that would contribute to this.  She has not had any new medications added or taking any new supplements.  I did not see any patches of hair loss to insinuate a autoimmune issue.  Encourage patient to start biotin for hair growth.  Likely her hair loss is cyclical or due to stress.  We will continue to monitor.  Pt needed screening labs and were ordered today.   Marland KitchenVernetta Honey PA-C, have reviewed and agree with the above documentation in it's entirety.

## 2018-03-23 NOTE — Progress Notes (Signed)
Call pt: thyroid in normal range but up a little from last year.  B12 is too high. Not need for any more shots. How much supplement are you taking of b12?  No anemia.  Kidney, liver, glucose look good.  Cholesterol looks good.  TG is a little elevated. Watching sugars and carbs.   Testosterone  and vit D pending.

## 2018-03-25 LAB — CBC WITH DIFFERENTIAL/PLATELET
BASOS PCT: 0.7 %
Basophils Absolute: 29 cells/uL (ref 0–200)
EOS ABS: 103 {cells}/uL (ref 15–500)
Eosinophils Relative: 2.5 %
HEMATOCRIT: 35.3 % (ref 35.0–45.0)
Hemoglobin: 12.2 g/dL (ref 11.7–15.5)
LYMPHS ABS: 1390 {cells}/uL (ref 850–3900)
MCH: 31 pg (ref 27.0–33.0)
MCHC: 34.6 g/dL (ref 32.0–36.0)
MCV: 89.8 fL (ref 80.0–100.0)
MPV: 11.4 fL (ref 7.5–12.5)
Monocytes Relative: 7.1 %
Neutro Abs: 2288 cells/uL (ref 1500–7800)
Neutrophils Relative %: 55.8 %
PLATELETS: 201 10*3/uL (ref 140–400)
RBC: 3.93 10*6/uL (ref 3.80–5.10)
RDW: 12.4 % (ref 11.0–15.0)
Total Lymphocyte: 33.9 %
WBC: 4.1 10*3/uL (ref 3.8–10.8)
WBCMIX: 291 {cells}/uL (ref 200–950)

## 2018-03-25 LAB — COMPLETE METABOLIC PANEL WITH GFR
AG Ratio: 1.7 (calc) (ref 1.0–2.5)
ALBUMIN MSPROF: 4 g/dL (ref 3.6–5.1)
ALT: 7 U/L (ref 6–29)
AST: 12 U/L (ref 10–30)
Alkaline phosphatase (APISO): 43 U/L (ref 33–115)
BILIRUBIN TOTAL: 0.9 mg/dL (ref 0.2–1.2)
BUN: 11 mg/dL (ref 7–25)
CHLORIDE: 106 mmol/L (ref 98–110)
CO2: 22 mmol/L (ref 20–32)
CREATININE: 1 mg/dL (ref 0.50–1.10)
Calcium: 9.1 mg/dL (ref 8.6–10.2)
GFR, Est African American: 82 mL/min/{1.73_m2} (ref >=60)
GFR, Est Non African American: 71 mL/min/{1.73_m2} (ref >=60)
GLUCOSE: 83 mg/dL (ref 65–99)
Globulin: 2.4 g/dL (calc) (ref 1.9–3.7)
Potassium: 3.8 mmol/L (ref 3.5–5.3)
Sodium: 138 mmol/L (ref 135–146)
Total Protein: 6.4 g/dL (ref 6.1–8.1)

## 2018-03-25 LAB — VITAMIN D 1,25 DIHYDROXY
VITAMIN D 1, 25 (OH) TOTAL: 65 pg/mL (ref 18–72)
VITAMIN D3 1, 25 (OH): 65 pg/mL

## 2018-03-25 LAB — LIPID PANEL W/REFLEX DIRECT LDL
CHOL/HDL RATIO: 3.3 (calc) (ref <5.0)
CHOLESTEROL: 154 mg/dL (ref <200)
HDL: 46 mg/dL — AB (ref >50)
LDL Cholesterol (Calc): 79 mg/dL (calc)
Non-HDL Cholesterol (Calc): 108 mg/dL (calc) (ref <130)
Triglycerides: 194 mg/dL — ABNORMAL HIGH (ref <150)

## 2018-03-25 LAB — TESTOSTERONE, TOTAL, LC/MS/MS: TESTOSTERONE, TOTAL, LC-MS-MS: 46 ng/dL — AB (ref 2–45)

## 2018-03-25 LAB — B12 AND FOLATE PANEL: Folate: 10.3 ng/mL

## 2018-03-25 LAB — TSH: TSH: 3.34 m[IU]/L

## 2018-03-28 NOTE — Progress Notes (Signed)
Call pt: testosterone is in. Just a hair elevated. Nothing concerning. Do not think this level is contributing to any hair loss. Certainly if continues to rise could be of concern. If hair loss worsens we could recheck.

## 2018-04-11 ENCOUNTER — Other Ambulatory Visit: Payer: Self-pay | Admitting: Physician Assistant

## 2018-04-11 DIAGNOSIS — F332 Major depressive disorder, recurrent severe without psychotic features: Secondary | ICD-10-CM

## 2018-04-27 ENCOUNTER — Other Ambulatory Visit: Payer: Self-pay | Admitting: Physician Assistant

## 2018-04-27 DIAGNOSIS — F332 Major depressive disorder, recurrent severe without psychotic features: Secondary | ICD-10-CM

## 2018-05-08 DIAGNOSIS — J019 Acute sinusitis, unspecified: Secondary | ICD-10-CM | POA: Diagnosis not present

## 2018-09-15 ENCOUNTER — Other Ambulatory Visit: Payer: Self-pay | Admitting: Physician Assistant

## 2018-10-01 ENCOUNTER — Other Ambulatory Visit: Payer: Self-pay | Admitting: Physician Assistant

## 2018-11-27 ENCOUNTER — Other Ambulatory Visit: Payer: Self-pay | Admitting: Physician Assistant

## 2018-12-02 ENCOUNTER — Encounter: Payer: Self-pay | Admitting: Physician Assistant

## 2018-12-02 ENCOUNTER — Ambulatory Visit (INDEPENDENT_AMBULATORY_CARE_PROVIDER_SITE_OTHER): Payer: BC Managed Care – PPO | Admitting: Physician Assistant

## 2018-12-02 VITALS — Ht 67.0 in | Wt 125.0 lb

## 2018-12-02 DIAGNOSIS — F411 Generalized anxiety disorder: Secondary | ICD-10-CM | POA: Diagnosis not present

## 2018-12-02 DIAGNOSIS — F332 Major depressive disorder, recurrent severe without psychotic features: Secondary | ICD-10-CM | POA: Diagnosis not present

## 2018-12-02 DIAGNOSIS — F5101 Primary insomnia: Secondary | ICD-10-CM | POA: Diagnosis not present

## 2018-12-02 DIAGNOSIS — G44209 Tension-type headache, unspecified, not intractable: Secondary | ICD-10-CM | POA: Diagnosis not present

## 2018-12-02 MED ORDER — DULOXETINE HCL 30 MG PO CPEP: 30.0000 mg | ORAL_CAPSULE | Freq: Every day | ORAL | 3 refills | Status: DC

## 2018-12-02 MED ORDER — CYCLOBENZAPRINE HCL 10 MG PO TABS: 10.0000 mg | ORAL_TABLET | Freq: Three times a day (TID) | ORAL | 0 refills | Status: DC | PRN

## 2018-12-02 MED ORDER — TRAZODONE HCL 50 MG PO TABS: ORAL_TABLET | ORAL | 3 refills | Status: DC

## 2018-12-02 MED ORDER — BUPROPION HCL ER (XL) 150 MG PO TB24: 150.0000 mg | ORAL_TABLET | Freq: Every day | ORAL | 3 refills | Status: DC

## 2018-12-02 NOTE — Progress Notes (Signed)
-  PHQ/GAD completed  -getting daily Has, thinks due to tension/stress, takes Aleve, helps a little  -headache 2 days ago caused "seeing stars or floaters", had to lay down, couldn't function

## 2018-12-02 NOTE — Progress Notes (Signed)
Patient ID: Carol Rodgers, female   DOB: 18-Mar-1976, 43 y.o.   MRN: [id number] .Marland KitchenVirtual Visit via Video Note  I connected with [name] on 12/05/18 at  2:40 PM EDT by a video enabled telemedicine application and verified that I am speaking with the correct person using two identifiers.  Location: Patient: home Provider: clinic   I discussed the limitations of evaluation and management by telemedicine and the availability of in person appointments. The patient expressed understanding and agreed to proceed.  History of Present Illness: Pt is a 43 yo female with insomnia, anxiety, MDD who calls into the clinic for medication refills.   Pt has good and bad days with anxiety and depression. Mostly good. She is sleeping well. Taking medication daily.   She is having more headaches. They seem to start at the back of her head and wrap around to the front. Seems to be happening more and more. She is taking aleve. 2 days ago she almost felt like she was getting a migraine. Denies any temple pressure. At times she feels like she can "see stars" right before headache starts. She at times will get a little nauseated.   .. Active Ambulatory Problems    Diagnosis Date Noted  . Depression 09/15/2013  . Sleeping difficulties 09/15/2013  . Anxiety state 09/15/2013  . Fatigue 10/13/2013  . Insomnia 04/05/2014  . Hot flashes 09/13/2015  . Dry skin 09/13/2015  . No energy 05/31/2017  . Hair loss 03/22/2018   Resolved Ambulatory Problems    Diagnosis Date Noted  . Acute non-recurrent maxillary sinusitis 09/23/2017   Past Medical History:  Diagnosis Date  . No pertinent past medical history   . PONV (postoperative nausea and vomiting)    Reviewed allergy, problem, med list.    Observations/Objective: No acute distress.  Normal mood.  Normal appearance.   .. Today's Vitals   12/02/18 1408  Weight: 125 lb (56.7 kg)  Height: 5\' 7"  (1.702 m)   Body mass index is 19.58  kg/m.  .. Depression screen [facility] 2/9 12/02/2018 03/22/2018 05/31/2017 10/30/2016 10/13/2013  Decreased Interest 1 0 1 2 3   Down, Depressed, Hopeless 0 0 1 1 2   PHQ - 2 Score 1 0 2 3 5   Altered sleeping 3 1 3 3 3   Tired, decreased energy 2 2 2 3 3   Change in appetite 0 0 1 0 0  Feeling bad or failure about yourself  0 0 1 1 2   Trouble concentrating 0 0 0 0 3  Moving slowly or fidgety/restless 0 0 0 1 0  Suicidal thoughts 0 0 0 0 0  PHQ-9 Score 6 3 9 11 16   Difficult doing work/chores Somewhat difficult Not difficult at all - - -   .Marland Kitchen GAD 7 : Generalized Anxiety Score 12/02/2018 03/22/2018 05/31/2017 10/30/2016  Nervous, Anxious, on Edge 1 1 1 2   Control/stop worrying 1 1 1 2   Worry too much - different things 1 1 1 2   Trouble relaxing 1 1 0 1  Restless 1 0 0 1  Easily annoyed or irritable 1 1 1 3   Afraid - awful might happen 1 0 0 2  Total GAD 7 Score 7 5 4 13   Anxiety Difficulty Somewhat difficult Somewhat difficult - -     Assessment and Plan: Marland KitchenMarland KitchenDiagnoses and all orders for this visit:  Severe episode of recurrent major depressive disorder, without psychotic features ([place]) -     buPROPion (WELLBUTRIN XL) 150 MG 24 hr  tablet; Take 1 tablet (150 mg total) by mouth daily. -     DULoxetine (CYMBALTA) 30 MG capsule; Take 1 capsule (30 mg total) by mouth daily.  Anxiety state -     buPROPion (WELLBUTRIN XL) 150 MG 24 hr tablet; Take 1 tablet (150 mg total) by mouth daily. -     DULoxetine (CYMBALTA) 30 MG capsule; Take 1 capsule (30 mg total) by mouth daily.  Primary insomnia -     traZODone (DESYREL) 50 MG tablet; TAKE 1/2 TO 1 TABLET BY MOUTH EVERY NIGHT AT BEDTIME AS NEEDED FOR SLEEP  Tension headache -     cyclobenzaprine (FLEXERIL) 10 MG tablet; Take 1 tablet (10 mg total) by mouth 3 (three) times daily as needed for muscle spasms.   Overall mood and sleep seems to be pretty well controlled. Refills sent.   She seems to be having some tension headaches. Given flexeril to use at  bedtime. Discussed good supportive pillow, tens unit, icy hot patches, massages. Continue to use tylenol and ibuprofen as needed.   Follow Up Instructions:    I discussed the assessment and treatment plan with the patient. The patient was provided an opportunity to ask questions and all were answered. The patient agreed with the plan and demonstrated an understanding of the instructions.   The patient was advised to call back or seek an in-person evaluation if the symptoms worsen or if the condition fails to improve as anticipated.    Iran Planas, PA-C

## 2018-12-05 ENCOUNTER — Encounter: Payer: Self-pay | Admitting: Physician Assistant

## 2018-12-05 ENCOUNTER — Other Ambulatory Visit: Payer: Self-pay | Admitting: Physician Assistant

## 2018-12-05 DIAGNOSIS — F411 Generalized anxiety disorder: Secondary | ICD-10-CM

## 2018-12-05 DIAGNOSIS — F332 Major depressive disorder, recurrent severe without psychotic features: Secondary | ICD-10-CM

## 2018-12-06 NOTE — Patient Instructions (Signed)
Tension Headache, Adult A tension headache is pain, pressure, or aching in your head. Tension headaches can last from 30 minutes to several days. Follow these instructions at home: Managing pain  Take over-the-counter and prescription medicines only as told by your doctor.  When you have a headache, lie down in a dark, quiet room.  If told, put ice on your head and neck: ? Put ice in a plastic bag. ? Place a towel between your skin and the bag. ? Leave the ice on for 20 minutes, 2-3 times a day.  If told, put heat on the back of your neck. Do this as often as your doctor tells you to. Use the kind of heat that your doctor recommends, such as a moist heat pack or a heating pad. ? Place a towel between your skin and the heat. ? Leave the heat on for 20-30 minutes. ? Remove the heat if your skin turns bright red. Eating and drinking  Eat meals on a regular schedule.  Watch how much alcohol you drink: ? If you are a woman and are not pregnant, do not drink more than 1 drink a day. ? If you are a man, do not drink more than 2 drinks a day.  Drink enough fluid to keep your pee (urine) pale yellow.  Do not use a lot of caffeine, or stop using caffeine. Lifestyle  Get enough sleep. Get 7-9 hours of sleep each night. Or get the amount of sleep that your doctor tells you to.  At bedtime, remove all electronic devices from your room. Examples of electronic devices are computers, phones, and tablets.  Find ways to lessen your stress. Some things that can lessen stress are: ? Exercise. ? Deep breathing. ? Yoga. ? Music. ? Positive thoughts.  Sit up straight. Do not tighten (tense) your muscles.  Do not use any products that have nicotine or tobacco in them, such as cigarettes and e-cigarettes. If you need help quitting, ask your doctor. General instructions   Keep all follow-up visits as told by your doctor. This is important.  Avoid things that can bring on headaches. Keep a  journal to find out if certain things bring on headaches. For example, write down: ? What you eat and drink. ? How much sleep you get. ? Any change to your diet or medicines. Contact a doctor if:  Your headache does not get better.  Your headache comes back.  You have a headache and sounds, light, or smells bother you.  You feel sick to your stomach (nauseous) or you throw up (vomit).  Your stomach hurts. Get help right away if:  You suddenly get a very bad headache along with any of these: ? A stiff neck. ? Feeling sick to your stomach. ? Throwing up. ? Feeling weak. ? Trouble seeing. ? Feeling short of breath. ? A rash. ? Feeling unusually sleepy. ? Trouble speaking. ? Pain in your eye or ear. ? Trouble walking or balancing. ? Feeling like you will pass out (faint). ? Passing out. Summary  A tension headache is pain, pressure, or aching in your head.  Tension headaches can last from 30 minutes to several days.  Lifestyle changes and medicines may help relieve pain.  Document Released: 09/09/2009 Document Revised: 09/25/2016 Document Reviewed: 09/25/2016 Elsevier Interactive Patient Education  2019 [employer].

## 2018-12-13 ENCOUNTER — Other Ambulatory Visit: Payer: Self-pay | Admitting: Physician Assistant

## 2018-12-13 DIAGNOSIS — F411 Generalized anxiety disorder: Secondary | ICD-10-CM

## 2018-12-13 DIAGNOSIS — F332 Major depressive disorder, recurrent severe without psychotic features: Secondary | ICD-10-CM

## 2019-07-19 ENCOUNTER — Other Ambulatory Visit: Payer: Self-pay | Admitting: Physician Assistant

## 2019-07-19 DIAGNOSIS — F332 Major depressive disorder, recurrent severe without psychotic features: Secondary | ICD-10-CM

## 2019-07-19 DIAGNOSIS — F411 Generalized anxiety disorder: Secondary | ICD-10-CM

## 2019-09-21 ENCOUNTER — Encounter: Payer: BC Managed Care – PPO | Admitting: Obstetrics & Gynecology

## 2019-09-29 ENCOUNTER — Other Ambulatory Visit: Payer: Self-pay | Admitting: Physician Assistant

## 2019-09-29 DIAGNOSIS — F411 Generalized anxiety disorder: Secondary | ICD-10-CM

## 2019-09-29 DIAGNOSIS — F332 Major depressive disorder, recurrent severe without psychotic features: Secondary | ICD-10-CM

## 2019-10-08 ENCOUNTER — Other Ambulatory Visit: Payer: Self-pay | Admitting: Physician Assistant

## 2019-10-08 DIAGNOSIS — F411 Generalized anxiety disorder: Secondary | ICD-10-CM

## 2019-10-08 DIAGNOSIS — F332 Major depressive disorder, recurrent severe without psychotic features: Secondary | ICD-10-CM

## 2019-10-10 ENCOUNTER — Encounter: Payer: Self-pay | Admitting: Certified Nurse Midwife

## 2019-10-10 ENCOUNTER — Encounter: Payer: Self-pay | Admitting: Gastroenterology

## 2019-10-10 ENCOUNTER — Telehealth: Payer: Self-pay | Admitting: *Deleted

## 2019-10-10 ENCOUNTER — Other Ambulatory Visit (HOSPITAL_COMMUNITY)
Admission: RE | Admit: 2019-10-10 | Discharge: 2019-10-10 | Disposition: A | Discharge status: Home / Self Care | Payer: BLUE CROSS/BLUE SHIELD | Source: Ambulatory Visit | Attending: Certified Nurse Midwife | Admitting: Certified Nurse Midwife

## 2019-10-10 ENCOUNTER — Other Ambulatory Visit: Payer: Self-pay

## 2019-10-10 ENCOUNTER — Ambulatory Visit (INDEPENDENT_AMBULATORY_CARE_PROVIDER_SITE_OTHER): Payer: BC Managed Care – PPO | Admitting: Certified Nurse Midwife

## 2019-10-10 VITALS — BP 120/60 | HR 101 | Temp 99.1°F | Resp 16 | Ht 67.0 in | Wt 128.0 lb

## 2019-10-10 DIAGNOSIS — F330 Major depressive disorder, recurrent, mild: Secondary | ICD-10-CM | POA: Diagnosis not present

## 2019-10-10 DIAGNOSIS — Z8719 Personal history of other diseases of the digestive system: Secondary | ICD-10-CM

## 2019-10-10 DIAGNOSIS — R102 Pelvic and perineal pain: Secondary | ICD-10-CM | POA: Diagnosis not present

## 2019-10-10 DIAGNOSIS — N941 Unspecified dyspareunia: Secondary | ICD-10-CM

## 2019-10-10 DIAGNOSIS — Z1151 Encounter for screening for human papillomavirus (HPV): Secondary | ICD-10-CM | POA: Diagnosis not present

## 2019-10-10 DIAGNOSIS — N898 Other specified noninflammatory disorders of vagina: Secondary | ICD-10-CM

## 2019-10-10 DIAGNOSIS — Z01419 Encounter for gynecological examination (general) (routine) without abnormal findings: Secondary | ICD-10-CM | POA: Diagnosis not present

## 2019-10-10 DIAGNOSIS — B9689 Other specified bacterial agents as the cause of diseases classified elsewhere: Secondary | ICD-10-CM | POA: Diagnosis not present

## 2019-10-10 DIAGNOSIS — N760 Acute vaginitis: Secondary | ICD-10-CM | POA: Diagnosis not present

## 2019-10-10 NOTE — Progress Notes (Signed)
GYNECOLOGY ANNUAL PREVENTATIVE CARE ENCOUNTER NOTE  History:     Carol Rodgers is a 44 y.o. [id number] female here for a new patient  gynecologic exam.  Current complaints: dyspareunia and pelvic pain. Patient reports vaginal discharge with occasional odor.  Denies abnormal vaginal bleeding other gynecologic concerns.  Patient reports regular 28-30 day cycle with 4-5 days of heavy vaginal bleeding.    Gynecologic History Patient's last menstrual period was 09/30/2019. Contraception: none Last Pap: date unknown but patient reports abnormal pap history Last mammogram: patient denies having mammogram  Colonoscopy: patient request colonoscopy as she has a family history (great aunt) of colon cancer   Obstetric History OB History  Gravida Para Term Preterm AB Living  2 2 1     2   SAB TAB Ectopic Multiple Live Births          1    # Outcome Date GA Lbr Len/2nd Weight Sex Delivery Anes PTL Lv  2 Term 06/07/12 [redacted]w[redacted]d  8 lb 7.1 oz (3.83 kg) M CS-LTranv Spinal  LIV  1 Para      CS-LTranv       Past Medical History:  Diagnosis Date  . Mental disorder   . No pertinent past medical history     Past Surgical History:  Procedure Laterality Date  . CESAREAN SECTION    . CESAREAN SECTION  06/07/2012   Procedure: CESAREAN SECTION;  Surgeon: Marylynn Pearson, MD;  Location: [facility];  Service: Obstetrics;  Laterality: N/A;  Repeat edc 06/10/12  . NASAL SINUS SURGERY    . TONSILLECTOMY      Current Outpatient Medications on File Prior to Visit  Medication Sig Dispense Refill  . buPROPion (WELLBUTRIN XL) 150 MG 24 hr tablet Take 1 tablet (150 mg total) by mouth daily. appt for further refills 30 tablet 3  . cyclobenzaprine (FLEXERIL) 10 MG tablet Take 1 tablet (10 mg total) by mouth 3 (three) times daily as needed for muscle spasms. 30 tablet 0  . DULoxetine (CYMBALTA) 30 MG capsule TAKE 1 CAPSULE BY MOUTH  DAILY 90 capsule 3  . traZODone (DESYREL) 50 MG tablet TAKE 1/2 TO 1 TABLET BY MOUTH  EVERY NIGHT AT BEDTIME AS NEEDED FOR SLEEP 90 tablet 3   No current facility-administered medications on file prior to visit.    Allergies  Allergen Reactions  . Viibryd [Vilazodone Hcl] Other (See Comments)    Severe anger    Social History:  reports that she has never smoked. She has never used smokeless tobacco. She reports current alcohol use. She reports that she does not use drugs.  Family History  Problem Relation Age of Onset  . Cancer Maternal Grandfather   . Hypertension Paternal Grandfather   . Heart attack Paternal Grandfather     The following portions of the patient's history were reviewed and updated as appropriate: allergies, current medications, past family history, past medical history, past social history, past surgical history and problem list.  Review of Systems Pertinent items noted in HPI and remainder of comprehensive ROS otherwise negative.  Physical Exam:  BP 120/60   Pulse (!) 101   Temp 99.1 F (37.3 C)   Resp 16   Ht 5\' 7"  (1.702 m)   Wt 128 lb (58.1 kg)   LMP 09/30/2019   BMI 20.05 kg/m  CONSTITUTIONAL: Well-developed, well-nourished female in no acute distress.  HENT:  Normocephalic, atraumatic, External right and left ear normal. Oropharynx is clear and moist EYES: Conjunctivae and  EOM are normal. Pupils are equal, round, and reactive to light NECK: Normal range of motion, supple, no masses.  Normal thyroid.  SKIN: Skin is warm and dry. No rash noted. Not diaphoretic. No erythema. No pallor. MUSCULOSKELETAL: Normal range of motion. No tenderness.  No cyanosis, clubbing, or edema.  2+ distal pulses. NEUROLOGIC: Alert and oriented to person, place, and time. Normal reflexes, muscle tone coordination.  PSYCHIATRIC: Normal mood and affect. Normal behavior. Normal judgment and thought content. CARDIOVASCULAR: Normal heart rate noted, regular rhythm RESPIRATORY: Clear to auscultation bilaterally. Effort and breath sounds normal, no problems with  respiration noted. BREASTS: Symmetric in size. No masses, tenderness, skin changes, nipple drainage, or lymphadenopathy bilaterally. Performed in the presence of a chaperone. ABDOMEN: Soft, no distention noted.  Suprapubic tenderness with palpation and RLQ tenderness with palpation. Negative McBurney's sign. No rebound or guarding.  PELVIC: Normal appearing external genitalia and urethral meatus; normal appearing vaginal mucosa and cervix. Moderate amount of white thin mucous discharge without odor.  Pap smear obtained.  Normal uterine size, no other palpable masses. Uterine and right adnexal tenderness. Uterus slightly enlarged to size of grapefruit.  Performed in the presence of a chaperone.   Assessment and Plan:    1. Well woman exam with routine gynecological exam - Cervicovaginal ancillary only( [place]) - Cytology - PAP( [place])  2. Vaginal discharge - Patient reports thin white vaginal discharge with occasional odor over the past 1-2 months  - patient denies OTC treatment for discharge  - swabs obtained today   3. Pelvic pain in female - Patient reports pelvic pain and dyspareunia has been occurring for the past year, has not been seen about complaints or had ultrasound  - patient denies hx of STDs, fibroids or diagnoses of endometriosis  - Pelvic US ordered, will follow up after [place] for management of pelvic pain and dyspareunia  - US Pelvis Complete; Future  4. History of chronic constipation - Patient reports hx of constipation and other bowel issues, pt request referral to gastroenterology for colonoscopy  - encouraged patient that we will send in referral and based on examination at gastro office will determine need for colonoscopy  - Ambulatory referral to Gastroenterology  5. Mild episode of recurrent major depressive disorder ([place]) - Patient reports she has been battling depression over the past 7 years, have been on multiple medications by PCP  - Patient reports  that medication is not seeming to help, reports she is sleep deprived, exhausted and do not feel like getting out of bed  - Encouraged patient to talk to PCP about multiple medication and see if she can get down to one as patient desires    Will follow up results of pap smear and manage accordingly. Mammogram ordered Pelvic US ordered  Referral to gastroenterology  Continue to follow up with PCP as scheduled for management of depression Routine preventative health maintenance measures emphasized. Please refer to After Visit Summary for other counseling recommendations.       Lajean Manes, [place] for [employer], [place]

## 2019-10-10 NOTE — Patient Instructions (Signed)
Endometriosis  Endometriosis is a condition in which the tissue that lines the uterus (endometrium) grows outside of its normal location. The tissue may grow in many locations close to the uterus, but it commonly grows on the ovaries, fallopian tubes, vagina, or bowel. When the uterus sheds the endometrium every menstrual cycle, there is bleeding wherever the endometrial tissue is located. This can cause pain because blood is irritating to tissues that are not normally exposed to it. What are the causes? The cause of endometriosis is not known. What increases the risk? You may be more likely to develop endometriosis if you:  Have a family history of endometriosis.  Have never given birth.  Started your period at age 10 or younger.  Have high levels of estrogen in your body.  Were exposed to a certain medicine (diethylstilbestrol) before you were born (in utero).  Had low birth weight.  Were born as a twin, triplet, or other multiple.  Have a BMI of less than 25. BMI is an estimate of body fat and is calculated from height and weight. What are the signs or symptoms? Often, there are no symptoms of this condition. If you do have symptoms, they may:  Vary depending on where your endometrial tissue is growing.  Occur during your menstrual period (most common) or midcycle.  Come and go, or you may go months with no symptoms at all.  Stop with menopause. Symptoms may include:  Pain in the back or abdomen.  Heavier bleeding during periods.  Pain during sex.  Painful bowel movements.  Infertility.  Pelvic pain.  Bleeding more than once a month. How is this diagnosed? This condition is diagnosed based on your symptoms and a physical exam. You may have tests, such as:  Blood tests and urine tests. These may be done to help rule out other possible causes of your symptoms.  Ultrasound, to look for abnormal tissues.  An X-ray of the lower bowel (barium enema).  An  ultrasound that is done through the vagina (transvaginally).  CT scan.  MRI.  Laparoscopy. In this procedure, a lighted, pencil-sized instrument called a laparoscope is inserted into your abdomen through an incision. The laparoscope allows your health care provider to look at the organs inside your body and check for abnormal tissue to confirm the diagnosis. If abnormal tissue is found, your health care provider may remove a small piece of tissue (biopsy) to be examined under a microscope. How is this treated? Treatment for this condition may include:  Medicines to relieve pain, such as NSAIDs.  Hormone therapy. This involves using artificial (synthetic) hormones to reduce endometrial tissue growth. Your health care provider may recommend using a hormonal form of birth control, or other medicines.  Surgery. This may be done to remove abnormal endometrial tissue. ? In some cases, tissue may be removed using a laparoscope and a laser (laparoscopic laser treatment). ? In severe cases, surgery may be done to remove the fallopian tubes, uterus, and ovaries (hysterectomy). Follow these instructions at home:  Take over-the-counter and prescription medicines only as told by your health care provider.  Do not drive or use heavy machinery while taking prescription pain medicine.  Try to avoid activities that cause pain, including sexual activity.  Keep all follow-up visits as told by your health care provider. This is important. Contact a health care provider if:  You have pain in the area between your hip bones (pelvic area) that occurs: ? Before, during, or after your period. ?   In between your period and gets worse during your period. ? During or after sex. ? With bowel movements or urination, especially during your period.  You have problems getting pregnant.  You have a fever. Get help right away if:  You have severe pain that does not get better with medicine.  You have severe  nausea and vomiting, or you cannot eat without vomiting.  You have pain that affects only the lower, right side of your abdomen.  You have abdominal pain that gets worse.  You have abdominal swelling.  You have blood in your stool. This information is not intended to replace advice given to you by your health care provider. Make sure you discuss any questions you have with your health care provider. Document Revised: 05/28/2017 Document Reviewed: 11/16/2015 Elsevier Patient Education  2020 Elsevier Inc.  

## 2019-10-10 NOTE — Addendum Note (Signed)
Addended by: Lajean Manes on: 10/10/2019 03:46 PM   Modules accepted: Level of Service

## 2019-10-11 NOTE — Telephone Encounter (Signed)
Opened on wrong patient.

## 2019-10-12 LAB — CYTOLOGY - PAP
Chlamydia: NEGATIVE
Comment: NEGATIVE
Comment: NEGATIVE
Comment: NORMAL
Diagnosis: NEGATIVE
High risk HPV: NEGATIVE
Neisseria Gonorrhea: NEGATIVE

## 2019-10-15 ENCOUNTER — Other Ambulatory Visit: Payer: Self-pay | Admitting: Physician Assistant

## 2019-10-15 DIAGNOSIS — F332 Major depressive disorder, recurrent severe without psychotic features: Secondary | ICD-10-CM

## 2019-10-15 DIAGNOSIS — F411 Generalized anxiety disorder: Secondary | ICD-10-CM

## 2019-10-23 LAB — CERVICOVAGINAL ANCILLARY ONLY
Bacterial Vaginitis (gardnerella): POSITIVE — AB
Candida Glabrata: NEGATIVE
Candida Vaginitis: NEGATIVE
Comment: NEGATIVE
Comment: NEGATIVE
Comment: NEGATIVE
Trichomonas: NEGATIVE

## 2019-10-23 MED ORDER — TINIDAZOLE 500 MG PO TABS: 2.0000 g | ORAL_TABLET | Freq: Every day | ORAL | 0 refills | Status: DC

## 2019-10-23 NOTE — Addendum Note (Signed)
Addended by: Lajean Manes on: 10/23/2019 03:36 PM   Modules accepted: Orders

## 2019-11-03 ENCOUNTER — Encounter: Payer: Self-pay | Admitting: Gastroenterology

## 2019-11-03 ENCOUNTER — Ambulatory Visit: Payer: BC Managed Care – PPO | Admitting: Gastroenterology

## 2019-11-03 ENCOUNTER — Other Ambulatory Visit: Payer: Self-pay

## 2019-11-03 VITALS — BP 104/70 | HR 100 | Temp 98.1°F | Ht 67.0 in | Wt 127.1 lb

## 2019-11-03 DIAGNOSIS — R195 Other fecal abnormalities: Secondary | ICD-10-CM | POA: Diagnosis not present

## 2019-11-03 DIAGNOSIS — Z800 Family history of malignant neoplasm of digestive organs: Secondary | ICD-10-CM

## 2019-11-03 DIAGNOSIS — K921 Melena: Secondary | ICD-10-CM

## 2019-11-03 DIAGNOSIS — Z01818 Encounter for other preprocedural examination: Secondary | ICD-10-CM

## 2019-11-03 DIAGNOSIS — K5900 Constipation, unspecified: Secondary | ICD-10-CM | POA: Diagnosis not present

## 2019-11-03 MED ORDER — CLENPIQ 10-3.5-12 MG-GM -GM/160ML PO SOLN: 1.0000 | Freq: Once | ORAL | 0 refills | Status: AC

## 2019-11-03 NOTE — Progress Notes (Signed)
Chief Complaint: Constipation, rectal pain, bloating   Referring Provider: Lajean Manes, CNM   HPI:    Carol Rodgers is a 44 y.o. female referred to the [facility] for evaluation of constipation, rectal pain, and abdominal bloating.  She has had constipation for last 2 years. Sxs worsening lately. No BM for a week at a time.  Previously trialed Miralax daily, with subsequent diarrhea. Now prn OTC Ex-Lax, which generally produces soft stool in 2-3 days later.  Occasional BRBPR.  Now also c/o thin stools for the last year or so.   Rectal pain and dyschezia for last 6 months or so.  Tends to be worse with episodes of pushing/straining.  Bloating and generalized abdominal pain when constipation sxs worsening.   Maternal great aunt (58's) and uncle (4's) (brother and sister of patient's MGM) with CRC. No known family history of CRC, GI malignancy, liver disease, pancreatic disease, or IBD.   No previous colonoscopy.  EGD as a child- diagnosed with stomach ulcers.    Past Medical History:  Diagnosis Date  . History of stomach ulcers    age 43  . Mental disorder   . No pertinent past medical history      Past Surgical History:  Procedure Laterality Date  . CESAREAN SECTION    . CESAREAN SECTION  06/07/2012   Procedure: CESAREAN SECTION;  Surgeon: Marylynn Pearson, MD;  Location: [facility];  Service: Obstetrics;  Laterality: N/A;  Repeat edc 06/10/12  . ESOPHAGOGASTRODUODENOSCOPY     age 14  . NASAL SINUS SURGERY    . TONSILLECTOMY     Family History  Problem Relation Age of Onset  . Lung cancer Maternal Grandfather   . Hypertension Paternal Grandfather   . Heart attack Paternal Grandfather   . Colon cancer Other        in maternal great aunt and uncle   Social History   Tobacco Use  . Smoking status: Former Research scientist (life sciences)  . Smokeless tobacco: Never Used  Substance Use Topics  . Alcohol use: Yes    Comment: socially  . Drug use: No    Current Outpatient Medications  Medication Sig Dispense Refill  . buPROPion (WELLBUTRIN XL) 150 MG 24 hr tablet Take 1 tablet (150 mg total) by mouth daily. appt for further refills 30 tablet 3  . cyclobenzaprine (FLEXERIL) 10 MG tablet Take 1 tablet (10 mg total) by mouth 3 (three) times daily as needed for muscle spasms. (Patient taking differently: Take 10 mg by mouth as needed for muscle spasms. ) 30 tablet 0  . DULoxetine (CYMBALTA) 30 MG capsule TAKE 1 CAPSULE BY MOUTH  DAILY 90 capsule 3  . traZODone (DESYREL) 50 MG tablet TAKE 1/2 TO 1 TABLET BY MOUTH EVERY NIGHT AT BEDTIME AS NEEDED FOR SLEEP 90 tablet 3   No current facility-administered medications for this visit.   Allergies  Allergen Reactions  . Viibryd [Vilazodone Hcl] Other (See Comments)    Severe anger     Review of Systems: All systems reviewed and negative except where noted in HPI.     Physical Exam:    Wt Readings from Last 3 Encounters:  11/03/19 127 lb 2 oz (57.7 kg)  10/10/19 128 lb (58.1 kg)  12/02/18 125 lb (56.7 kg)    BP 104/70   Pulse 100   Temp 98.1 F (36.7 C)   Ht 5\' 7"  (1.702 m)   Wt  127 lb 2 oz (57.7 kg)   BMI 19.91 kg/m  Constitutional:  Pleasant, in no acute distress. Psychiatric: Normal mood and affect. Behavior is normal. EENT: Pupils normal.  Conjunctivae are normal. No scleral icterus. Neck supple. No cervical LAD. Cardiovascular: Normal rate, regular rhythm. No edema Pulmonary/chest: Effort normal and breath sounds normal. No wheezing, rales or rhonchi. Abdominal: Mild generalized abdominal discomfort without rebound or guarding.  No peritoneal signs.  Soft, nondistended.  Bowel sounds active throughout. There are no masses palpable. No hepatomegaly. Neurological: Alert and oriented to person place and time. Skin: Skin is warm and dry. No rashes noted. Rectal: Exam deferred by patient to under anesthesia to assess.    ASSESSMENT AND PLAN;   1) Constipation 2)  Hematochezia 3) Dyschezia 4) Abdominal bloating 5) Generalized abdominal pain 6) Change in bowel habits 7) Family history of colon cancer  Discussed DDx for presenting symptoms to include underlying constipation vs pelvic floor dyssynergia with subsequent anorectal pathology (i.e. hemorrhoids, anal fissure).  Given change in bowel habits (thin stools), family history of colon cancer, increasing symptoms, and elevated patient concerns, plan for colonoscopy  - Colonoscopy to evaluate for mucosal/luminal pathology -Rectal exam deferred by patient today.  Plan for exam at time of colonoscopy under anesthesia to assess for fissure, hemorrhoids, etc. - ARM if negative - CBC, BMP, TSH check -Continue drinking plenty of fluids  The indications, risks, and benefits of colonoscopy were explained to the patient in detail. Risks include but are not limited to bleeding, perforation, adverse reaction to medications, and cardiopulmonary compromise. Sequelae include but are not limited to the possibility of surgery, hospitalization, and mortality. The patient verbalized understanding and wished to proceed. All questions answered, referred to the scheduler and bowel prep ordered. Further recommendations pending results of the exam.      Lavena Bullion, DO, FACG  11/03/2019, 2:04 PM   Lajean Manes, CNM

## 2019-11-03 NOTE — Patient Instructions (Signed)
You have been scheduled for a colonoscopy. Please follow written instructions given to you at your visit today.  Please pick up your prep supplies at the pharmacy within the next 1-3 days. If you use inhalers (even only as needed), please bring them with you on the day of your procedure. Your physician has requested that you go to www.startemmi.com and enter the access code given to you at your visit today. This web site gives a general overview about your procedure. However, you should still follow specific instructions given to you by our office regarding your preparation for the procedure.  Your provider has requested that you go to the basement level for lab work at [facility] in [place] [address]. Press "B" on the elevator. The lab is located at the first door on the left as you exit the elevator.  Due to recent changes in healthcare laws, you may see the results of your imaging and laboratory studies on MyChart before your provider has had a chance to review them.  We understand that in some cases there may be results that are confusing or concerning to you. Not all laboratory results come back in the same time frame and the provider may be waiting for multiple results in order to interpret others.  Please give [place] 48 hours in order for your provider to thoroughly review all the results before contacting the office for clarification of your results.

## 2019-11-06 ENCOUNTER — Encounter: Payer: Self-pay | Admitting: Gastroenterology

## 2019-11-10 ENCOUNTER — Other Ambulatory Visit: Payer: Self-pay | Admitting: Gastroenterology

## 2019-11-10 ENCOUNTER — Other Ambulatory Visit: Payer: Self-pay

## 2019-11-10 ENCOUNTER — Other Ambulatory Visit (INDEPENDENT_AMBULATORY_CARE_PROVIDER_SITE_OTHER): Payer: BC Managed Care – PPO

## 2019-11-10 ENCOUNTER — Ambulatory Visit (INDEPENDENT_AMBULATORY_CARE_PROVIDER_SITE_OTHER): Payer: BC Managed Care – PPO

## 2019-11-10 DIAGNOSIS — R195 Other fecal abnormalities: Secondary | ICD-10-CM | POA: Diagnosis not present

## 2019-11-10 DIAGNOSIS — K5900 Constipation, unspecified: Secondary | ICD-10-CM | POA: Diagnosis not present

## 2019-11-10 DIAGNOSIS — Z1159 Encounter for screening for other viral diseases: Secondary | ICD-10-CM

## 2019-11-10 DIAGNOSIS — K921 Melena: Secondary | ICD-10-CM | POA: Diagnosis not present

## 2019-11-10 LAB — BASIC METABOLIC PANEL
BUN: 13 mg/dL (ref 6–23)
CO2: 25 mEq/L (ref 19–32)
Calcium: 8.9 mg/dL (ref 8.4–10.5)
Chloride: 103 mEq/L (ref 96–112)
Creatinine, Ser: 1.04 mg/dL (ref 0.40–1.20)
GFR: 58.24 mL/min — ABNORMAL LOW (ref >60.00)
Glucose, Bld: 91 mg/dL (ref 70–99)
Potassium: 3.8 mEq/L (ref 3.5–5.1)
Sodium: 135 mEq/L (ref 135–145)

## 2019-11-10 LAB — CBC WITH DIFFERENTIAL/PLATELET
Basophils Absolute: 0.1 10*3/uL (ref 0.0–0.1)
Basophils Relative: 1.2 % (ref 0.0–3.0)
Eosinophils Absolute: 0.2 10*3/uL (ref 0.0–0.7)
Eosinophils Relative: 4.1 % (ref 0.0–5.0)
HCT: 38 % (ref 36.0–46.0)
Hemoglobin: 13.1 g/dL (ref 12.0–15.0)
Lymphocytes Relative: 27.2 % (ref 12.0–46.0)
Lymphs Abs: 1.2 10*3/uL (ref 0.7–4.0)
MCHC: 34.4 g/dL (ref 30.0–36.0)
MCV: 90.2 fl (ref 78.0–100.0)
Monocytes Absolute: 0.3 10*3/uL (ref 0.1–1.0)
Monocytes Relative: 6.4 % (ref 3.0–12.0)
Neutro Abs: 2.6 10*3/uL (ref 1.4–7.7)
Neutrophils Relative %: 61.1 % (ref 43.0–77.0)
Platelets: 222 10*3/uL (ref 150.0–400.0)
RBC: 4.21 Mil/uL (ref 3.87–5.11)
RDW: 13 % (ref 11.5–15.5)
WBC: 4.3 10*3/uL (ref 4.0–10.5)

## 2019-11-10 LAB — SARS CORONAVIRUS 2 (TAT 6-24 HRS): SARS Coronavirus 2: NEGATIVE

## 2019-11-10 LAB — TSH: TSH: 4.3 u[IU]/mL (ref 0.35–4.50)

## 2019-11-14 ENCOUNTER — Encounter: Payer: Self-pay | Admitting: Gastroenterology

## 2019-11-14 ENCOUNTER — Other Ambulatory Visit: Payer: Self-pay

## 2019-11-14 ENCOUNTER — Other Ambulatory Visit: Payer: Self-pay | Admitting: Gastroenterology

## 2019-11-14 ENCOUNTER — Ambulatory Visit (AMBULATORY_SURGERY_CENTER): Payer: BC Managed Care – PPO | Admitting: Gastroenterology

## 2019-11-14 VITALS — BP 109/74 | HR 80 | Temp 97.8°F | Resp 13 | Ht 67.0 in | Wt 127.0 lb

## 2019-11-14 DIAGNOSIS — K921 Melena: Secondary | ICD-10-CM

## 2019-11-14 DIAGNOSIS — K5730 Diverticulosis of large intestine without perforation or abscess without bleeding: Secondary | ICD-10-CM | POA: Diagnosis not present

## 2019-11-14 DIAGNOSIS — K6289 Other specified diseases of anus and rectum: Secondary | ICD-10-CM | POA: Diagnosis not present

## 2019-11-14 DIAGNOSIS — K5900 Constipation, unspecified: Secondary | ICD-10-CM

## 2019-11-14 DIAGNOSIS — D120 Benign neoplasm of cecum: Secondary | ICD-10-CM

## 2019-11-14 DIAGNOSIS — R194 Change in bowel habit: Secondary | ICD-10-CM

## 2019-11-14 DIAGNOSIS — K635 Polyp of colon: Secondary | ICD-10-CM | POA: Diagnosis not present

## 2019-11-14 MED ORDER — AMBULATORY NON FORMULARY MEDICATION: 0 refills | Status: DC

## 2019-11-14 MED ORDER — SODIUM CHLORIDE 0.9 % IV SOLN: 500.0000 mL | Freq: Once | INTRAVENOUS | Status: DC

## 2019-11-14 NOTE — Progress Notes (Signed)
Called to room to assist during endoscopic procedure.  Patient ID and intended procedure confirmed with present staff. Received instructions for my participation in the procedure from the performing physician.  

## 2019-11-14 NOTE — Progress Notes (Signed)
Pt's states no medical or surgical changes since previsit or office visit. 

## 2019-11-14 NOTE — Progress Notes (Signed)
To PACU, VSS. Report to [email or web address] 

## 2019-11-14 NOTE — Patient Instructions (Signed)
HANDOUTS PROVIDED ON: POLYPS & DIVERTICULOSIS  The polyp removed today have been sent for pathology.  The results can take 1-3 weeks to receive.  When your next colonoscopy should occur will be based on the pathology results.    You may resume your previous diet and medication schedule.  Thank you for allowing us to care for you today!!!   YOU HAD AN ENDOSCOPIC PROCEDURE TODAY AT THE [facility]:   Refer to the procedure report that was given to you for any specific questions about what was found during the examination.  If the procedure report does not answer your questions, please call your gastroenterologist to clarify.  If you requested that your care partner not be given the details of your procedure findings, then the procedure report has been included in a sealed envelope for you to review at your convenience later.  YOU SHOULD EXPECT: Some feelings of bloating in the abdomen. Passage of more gas than usual.  Walking can help get rid of the air that was put into your GI tract during the procedure and reduce the bloating. If you had a lower endoscopy (such as a colonoscopy or flexible sigmoidoscopy) you may notice spotting of blood in your stool or on the toilet paper. If you underwent a bowel prep for your procedure, you may not have a normal bowel movement for a few days.  Please Note:  You might notice some irritation and congestion in your nose or some drainage.  This is from the oxygen used during your procedure.  There is no need for concern and it should clear up in a day or so.  SYMPTOMS TO REPORT IMMEDIATELY:   Following lower endoscopy (colonoscopy or flexible sigmoidoscopy):  Excessive amounts of blood in the stool  Significant tenderness or worsening of abdominal pains  Swelling of the abdomen that is new, acute  Fever of 100F or higher  For urgent or emergent issues, a gastroenterologist can be reached at any hour by calling [phone]. Do not use MyChart  messaging for urgent concerns.    DIET:  We do recommend a small meal at first, but then you may proceed to your regular diet.  Drink plenty of fluids but you should avoid alcoholic beverages for 24 hours.  ACTIVITY:  You should plan to take it easy for the rest of today and you should NOT DRIVE or use heavy machinery until tomorrow (because of the sedation medicines used during the test).    FOLLOW UP: Our staff will call the number listed on your records 48-72 hours following your procedure to check on you and address any questions or concerns that you may have regarding the information given to you following your procedure. If we do not reach you, we will leave a message.  We will attempt to reach you two times.  During this call, we will ask if you have developed any symptoms of COVID 19. If you develop any symptoms (ie: fever, flu-like symptoms, shortness of breath, cough etc.) before then, please call [phone].  If you test positive for Covid 19 in the 2 weeks post procedure, please call and report this information to us.    If any biopsies were taken you will be contacted by phone or by letter within the next 1-3 weeks.  Please call us at [phone] if you have not heard about the biopsies in 3 weeks.    SIGNATURES/CONFIDENTIALITY: You and/or your care partner have signed paperwork which will be   entered into your electronic medical record.  These signatures attest to the fact that that the information above on your After Visit Summary has been reviewed and is understood.  Full responsibility of the confidentiality of this discharge information lies with you and/or your care-partner. 

## 2019-11-14 NOTE — Op Note (Signed)
[facility] Patient Name: Carol Rodgers Procedure Date: 11/14/2019 11:21 AM MRN: [id number] Endoscopist: Gerrit Heck , MD Age: 44 Referring MD:  Date of Birth: 1976/05/30 Gender: Female Account #: [id number] Procedure:                Colonoscopy Indications:              Hematochezia, Change in bowel habits, Constipation,                            Rectal pain Medicines:                Monitored Anesthesia Care Procedure:                Pre-Anesthesia Assessment:                           - Prior to the procedure, a History and Physical                            was performed, and patient medications and                            allergies were reviewed. The patient's tolerance of                            previous anesthesia was also reviewed. The risks                            and benefits of the procedure and the sedation                            options and risks were discussed with the patient.                            All questions were answered, and informed consent                            was obtained. Prior Anticoagulants: The patient has                            taken no previous anticoagulant or antiplatelet                            agents. ASA Grade Assessment: II - A patient with                            mild systemic disease. After reviewing the risks                            and benefits, the patient was deemed in                            satisfactory condition to undergo the procedure.  After obtaining informed consent, the colonoscope                            was passed under direct vision. Throughout the                            procedure, the patient's blood pressure, pulse, and                            oxygen saturations were monitored continuously. The                            Colonoscope was introduced through the anus and                            advanced to the the terminal ileum. The  colonoscopy                            was performed without difficulty. The patient                            tolerated the procedure well. The quality of the                            bowel preparation was adequate. The terminal ileum,                            ileocecal valve, appendiceal orifice, and rectum                            were photographed. Scope In: 11:26:25 AM Scope Out: 11:43:50 AM Scope Withdrawal Time: 0 hours 11 minutes 58 seconds  Total Procedure Duration: 0 hours 17 minutes 25 seconds  Findings:                 The perianal and digital rectal examinations were                            normal.                           A 4 mm polyp was found in the cecum. The polyp was                            sessile. The polyp was removed with a cold snare.                            Resection and retrieval were complete. Estimated                            blood loss was minimal.                           A single large-mouthed diverticulum was found in  the ascending colon.                           The sigmoid colon was moderately tortuous.                            Advancing the scope required using manual pressure.                            There was minor mucosal trauma from colonsocope                            advancement, but otherwise the mucosa was normal                            appearing without edema, erythema, erosions, or                            ulceration.                           The exam was otherwise normal throughout the                            remainder of the colon.                           The retroflexed view of the distal rectum and anal                            verge was normal and showed no anal or rectal                            abnormalities.                           The terminal ileum appeared normal. Complications:            No immediate complications. Estimated Blood Loss:     Estimated blood  loss was minimal. Impression:               - One 4 mm polyp in the cecum, removed with a cold                            snare. Resected and retrieved.                           - Diverticulosis in the ascending colon.                           - Tortuous sigmoid colon.                           - The distal rectum and anal verge are normal on  retroflexion view.                           - The examined portion of the ileum was normal. Recommendation:           - Patient has a contact number available for                            emergencies. The signs and symptoms of potential                            delayed complications were discussed with the                            patient. Return to normal activities tomorrow.                            Written discharge instructions were provided to the                            patient.                           - Resume previous diet.                           - Continue present medications.                           - Await pathology results.                           - Repeat colonoscopy in 5-10 years for surveillance                            based on pathology results.                           - Return to GI office PRN. Gerrit Heck, MD 11/14/2019 11:49:36 AM

## 2019-11-16 ENCOUNTER — Ambulatory Visit (INDEPENDENT_AMBULATORY_CARE_PROVIDER_SITE_OTHER): Payer: BC Managed Care – PPO

## 2019-11-16 ENCOUNTER — Telehealth: Payer: Self-pay

## 2019-11-16 ENCOUNTER — Other Ambulatory Visit: Payer: Self-pay

## 2019-11-16 DIAGNOSIS — R102 Pelvic and perineal pain: Secondary | ICD-10-CM

## 2019-11-16 DIAGNOSIS — Z01419 Encounter for gynecological examination (general) (routine) without abnormal findings: Secondary | ICD-10-CM | POA: Diagnosis not present

## 2019-11-16 DIAGNOSIS — N852 Hypertrophy of uterus: Secondary | ICD-10-CM | POA: Diagnosis not present

## 2019-11-16 DIAGNOSIS — Z1231 Encounter for screening mammogram for malignant neoplasm of breast: Secondary | ICD-10-CM | POA: Diagnosis not present

## 2019-11-16 DIAGNOSIS — N941 Unspecified dyspareunia: Secondary | ICD-10-CM

## 2019-11-16 NOTE — Telephone Encounter (Signed)
  Follow up Call-  Call back number 11/14/2019  Post procedure Call Back phone  # [phone]  Permission to leave phone message Yes  Some recent data might be hidden     Patient questions:  Do you have a fever, pain , or abdominal swelling? No. Pain Score  0 *  Have you tolerated food without any problems? Yes.    Have you been able to return to your normal activities? Yes.    Do you have any questions about your discharge instructions: Diet   No. Medications  No. Follow up visit  No.  Do you have questions or concerns about your Care? No.  Actions: * If pain score is 4 or above: No action needed, pain <4.  1. Have you developed a fever since your procedure? no  2.   Have you had an respiratory symptoms (SOB or cough) since your procedure? no  3.   Have you tested positive for COVID 19 since your procedure no  4.   Have you had any family members/close contacts diagnosed with the COVID 19 since your procedure?  no   If yes to any of these questions please route to Joylene John, RN and Erenest Rasher, RN

## 2019-11-17 ENCOUNTER — Encounter: Payer: Self-pay | Admitting: Gastroenterology

## 2019-12-17 ENCOUNTER — Other Ambulatory Visit: Payer: Self-pay | Admitting: Physician Assistant

## 2019-12-17 DIAGNOSIS — F332 Major depressive disorder, recurrent severe without psychotic features: Secondary | ICD-10-CM

## 2019-12-17 DIAGNOSIS — F411 Generalized anxiety disorder: Secondary | ICD-10-CM

## 2020-01-14 ENCOUNTER — Other Ambulatory Visit: Payer: Self-pay | Admitting: Physician Assistant

## 2020-01-14 DIAGNOSIS — F411 Generalized anxiety disorder: Secondary | ICD-10-CM

## 2020-01-14 DIAGNOSIS — F332 Major depressive disorder, recurrent severe without psychotic features: Secondary | ICD-10-CM

## 2020-01-16 ENCOUNTER — Ambulatory Visit: Payer: BC Managed Care – PPO | Admitting: Gastroenterology

## 2020-01-18 ENCOUNTER — Ambulatory Visit: Payer: BC Managed Care – PPO | Admitting: Gastroenterology

## 2020-01-30 ENCOUNTER — Other Ambulatory Visit: Payer: Self-pay | Admitting: Physician Assistant

## 2020-01-30 DIAGNOSIS — F5101 Primary insomnia: Secondary | ICD-10-CM

## 2020-02-11 ENCOUNTER — Other Ambulatory Visit: Payer: Self-pay | Admitting: Physician Assistant

## 2020-02-11 DIAGNOSIS — F411 Generalized anxiety disorder: Secondary | ICD-10-CM

## 2020-02-11 DIAGNOSIS — F332 Major depressive disorder, recurrent severe without psychotic features: Secondary | ICD-10-CM

## 2020-02-20 ENCOUNTER — Other Ambulatory Visit: Payer: Self-pay | Admitting: Physician Assistant

## 2020-02-20 DIAGNOSIS — F411 Generalized anxiety disorder: Secondary | ICD-10-CM

## 2020-02-20 DIAGNOSIS — F332 Major depressive disorder, recurrent severe without psychotic features: Secondary | ICD-10-CM

## 2020-02-26 ENCOUNTER — Ambulatory Visit: Payer: BC Managed Care – PPO | Admitting: Gastroenterology

## 2020-03-06 ENCOUNTER — Other Ambulatory Visit: Payer: Self-pay | Admitting: Physician Assistant

## 2020-03-06 DIAGNOSIS — F5101 Primary insomnia: Secondary | ICD-10-CM

## 2020-04-08 ENCOUNTER — Telehealth: Payer: Self-pay | Admitting: Neurology

## 2020-04-08 DIAGNOSIS — F332 Major depressive disorder, recurrent severe without psychotic features: Secondary | ICD-10-CM

## 2020-04-08 DIAGNOSIS — F411 Generalized anxiety disorder: Secondary | ICD-10-CM

## 2020-04-08 DIAGNOSIS — F5101 Primary insomnia: Secondary | ICD-10-CM

## 2020-04-08 MED ORDER — DULOXETINE HCL 30 MG PO CPEP: 30.0000 mg | ORAL_CAPSULE | Freq: Every day | ORAL | 0 refills | Status: DC

## 2020-04-08 MED ORDER — TRAZODONE HCL 50 MG PO TABS: ORAL_TABLET | ORAL | 0 refills | Status: DC

## 2020-04-08 NOTE — Telephone Encounter (Signed)
Patient has appt on 04/17/2020 and left vm asking for refills of Duloxetine and Trazodone prior to appt. RX's sent. Called patient and made her aware.

## 2020-04-17 ENCOUNTER — Telehealth (INDEPENDENT_AMBULATORY_CARE_PROVIDER_SITE_OTHER): Payer: BC Managed Care – PPO | Admitting: Physician Assistant

## 2020-04-17 VITALS — Ht 67.0 in | Wt 127.0 lb

## 2020-04-17 DIAGNOSIS — R5382 Chronic fatigue, unspecified: Secondary | ICD-10-CM

## 2020-04-17 DIAGNOSIS — F332 Major depressive disorder, recurrent severe without psychotic features: Secondary | ICD-10-CM

## 2020-04-17 DIAGNOSIS — F411 Generalized anxiety disorder: Secondary | ICD-10-CM | POA: Diagnosis not present

## 2020-04-17 DIAGNOSIS — F5101 Primary insomnia: Secondary | ICD-10-CM

## 2020-04-17 MED ORDER — MODAFINIL 100 MG PO TABS: 100.0000 mg | ORAL_TABLET | Freq: Every day | ORAL | 0 refills | Status: DC

## 2020-04-17 MED ORDER — TRAZODONE HCL 50 MG PO TABS: ORAL_TABLET | ORAL | 3 refills | Status: DC

## 2020-04-17 MED ORDER — DULOXETINE HCL 30 MG PO CPEP: 30.0000 mg | ORAL_CAPSULE | Freq: Every day | ORAL | 3 refills | Status: DC

## 2020-04-17 MED ORDER — BUPROPION HCL ER (XL) 150 MG PO TB24: 150.0000 mg | ORAL_TABLET | Freq: Every day | ORAL | 3 refills | Status: DC

## 2020-04-17 NOTE — Progress Notes (Signed)
Patient needs refills Having issues with fatigue, feels like her PHQ/GAD numbers are worse due to feeling tired and not having energy to do things PHQ9 (9) -GAD7 (9) completed.

## 2020-04-17 NOTE — Progress Notes (Signed)
Patient ID: Carol Rodgers, female   DOB: 03-Oct-1975, 44 y.o.   MRN: [id number] .Marland KitchenVirtual Visit via Telephone Note  I connected with Carol Rodgers on 04/17/2020 at  1:20 PM EDT by telephone and verified that I am speaking with the correct person using two identifiers.  Location: Patient: home Provider: clinic   I discussed the limitations, risks, security and privacy concerns of performing an evaluation and management service by telephone and the availability of in person appointments. I also discussed with the patient that there may be a patient responsible charge related to this service. The patient expressed understanding and agreed to proceed.   History of Present Illness: Patient is a 44 year old female with MDD, GAD, insomnia who calls into the clinic for medication refill.  Patient feels like her anxiety and depression are much better controlled. She is sleeping on trazodone. She continues to suffer with extreme fatigue. Honestly she is complained of fatigue for years. She denies any suicidal thoughts or homicidal idealizations. She is trying to regularly mood and stay active. She denies any shortness of breath or chest pain. She denies any snoring. She does wake up feeling rested.  .. Active Ambulatory Problems    Diagnosis Date Noted   Depression 09/15/2013   Sleeping difficulties 09/15/2013   Anxiety state 09/15/2013   Chronic fatigue 10/13/2013   Insomnia 04/05/2014   Hot flashes 09/13/2015   Dry skin 09/13/2015   No energy 05/31/2017   Hair loss 03/22/2018   Resolved Ambulatory Problems    Diagnosis Date Noted   Acute non-recurrent maxillary sinusitis 09/23/2017   Past Medical History:  Diagnosis Date   History of stomach ulcers    Mental disorder    No pertinent past medical history    Reviewed med, allergy, problem list.     Observations/Objective: No acute distress  .Marland Kitchen Today's Vitals   04/17/20 1314  Weight: 127 lb (57.6 kg)  Height: 5\' 7"   (1.702 m)   Body mass index is 19.89 kg/m.  .. Depression screen [facility] 2/9 04/17/2020 12/02/2018 03/22/2018 05/31/2017 10/30/2016  Decreased Interest 2 1 0 1 2  Down, Depressed, Hopeless 1 0 0 1 1  PHQ - 2 Score 3 1 0 2 3  Altered sleeping 2 3 1 3 3   Tired, decreased energy 3 2 2 2 3   Change in appetite 0 0 0 1 0  Feeling bad or failure about yourself  1 0 0 1 1  Trouble concentrating 0 0 0 0 0  Moving slowly or fidgety/restless 0 0 0 0 1  Suicidal thoughts 0 0 0 0 0  PHQ-9 Score 9 6 3 9 11   Difficult doing work/chores Somewhat difficult Somewhat difficult Not difficult at all - -   .Marland Kitchen GAD 7 : Generalized Anxiety Score 04/17/2020 12/02/2018 03/22/2018 05/31/2017  Nervous, Anxious, on Edge 2 1 1 1   Control/stop worrying 1 1 1 1   Worry too much - different things 1 1 1 1   Trouble relaxing 1 1 1  0  Restless 1 1 0 0  Easily annoyed or irritable 2 1 1 1   Afraid - awful might happen 1 1 0 0  Total GAD 7 Score 9 7 5 4   Anxiety Difficulty Not difficult at all Somewhat difficult Somewhat difficult -       Assessment and Plan: Marland KitchenMarland KitchenTaryn was seen today for depression.  Diagnoses and all orders for this visit:  Chronic fatigue -     modafinil (PROVIGIL) 100 MG tablet; Take 1  tablet (100 mg total) by mouth daily.  Severe episode of recurrent major depressive disorder, without psychotic features ([place]) -     DULoxetine (CYMBALTA) 30 MG capsule; Take 1 capsule (30 mg total) by mouth daily. -     buPROPion (WELLBUTRIN XL) 150 MG 24 hr tablet; Take 1 tablet (150 mg total) by mouth daily.  Anxiety state -     DULoxetine (CYMBALTA) 30 MG capsule; Take 1 capsule (30 mg total) by mouth daily. -     buPROPion (WELLBUTRIN XL) 150 MG 24 hr tablet; Take 1 tablet (150 mg total) by mouth daily.  Primary insomnia -     traZODone (DESYREL) 50 MG tablet; TAKE 1/2 TO 1 TABLET BY MOUTH EVERY NIGHT AT BEDTIME PRN SLEEP   Seems like we have controlled her insomnia, depression, anxiety but she still suffers  from fatigue. We have checked thyroid multiple times, B12, vitamin D, CBC to look for other causes of fatigue with no avail. I suspect she does have chronic fatigue syndrome. We will start with Provigil to see if we can take this in the morning and she has a little more energy. Follow-up in 4 weeks.   Follow Up Instructions:    I discussed the assessment and treatment plan with the patient. The patient was provided an opportunity to ask questions and all were answered. The patient agreed with the plan and demonstrated an understanding of the instructions.   The patient was advised to call back or seek an in-person evaluation if the symptoms worsen or if the condition fails to improve as anticipated.  I provided 20 minutes of non-face-to-face time during this encounter.   Iran Planas, PA-C

## 2020-04-19 ENCOUNTER — Encounter: Payer: Self-pay | Admitting: Physician Assistant

## 2020-04-22 ENCOUNTER — Other Ambulatory Visit: Payer: Self-pay | Admitting: Physician Assistant

## 2020-04-22 ENCOUNTER — Encounter: Payer: Self-pay | Admitting: Physician Assistant

## 2020-04-22 DIAGNOSIS — R5382 Chronic fatigue, unspecified: Secondary | ICD-10-CM

## 2020-04-22 MED ORDER — MODAFINIL 100 MG PO TABS: 100.0000 mg | ORAL_TABLET | Freq: Every day | ORAL | 0 refills | Status: DC

## 2020-05-20 ENCOUNTER — Other Ambulatory Visit: Payer: Self-pay | Admitting: Physician Assistant

## 2020-05-20 DIAGNOSIS — R5382 Chronic fatigue, unspecified: Secondary | ICD-10-CM

## 2020-05-22 MED ORDER — MODAFINIL 100 MG PO TABS: 100.0000 mg | ORAL_TABLET | Freq: Every day | ORAL | 0 refills | Status: DC

## 2020-06-20 ENCOUNTER — Other Ambulatory Visit: Payer: Self-pay | Admitting: Family Medicine

## 2020-06-20 DIAGNOSIS — R5382 Chronic fatigue, unspecified: Secondary | ICD-10-CM

## 2020-06-25 ENCOUNTER — Other Ambulatory Visit: Payer: Self-pay | Admitting: Family Medicine

## 2020-06-25 DIAGNOSIS — R5382 Chronic fatigue, unspecified: Secondary | ICD-10-CM

## 2020-07-01 MED ORDER — MODAFINIL 100 MG PO TABS: 100.0000 mg | ORAL_TABLET | Freq: Every day | ORAL | 0 refills | Status: DC

## 2020-08-30 IMAGING — MG DIGITAL SCREENING BILAT W/ TOMO W/ CAD
8 series · 8 of 24 positions shown · non-contrast
Comparison: None.

CLINICAL DATA: Screening.

EXAM:
DIGITAL SCREENING BILATERAL MAMMOGRAM WITH TOMO AND CAD

[R CC synth-2D]
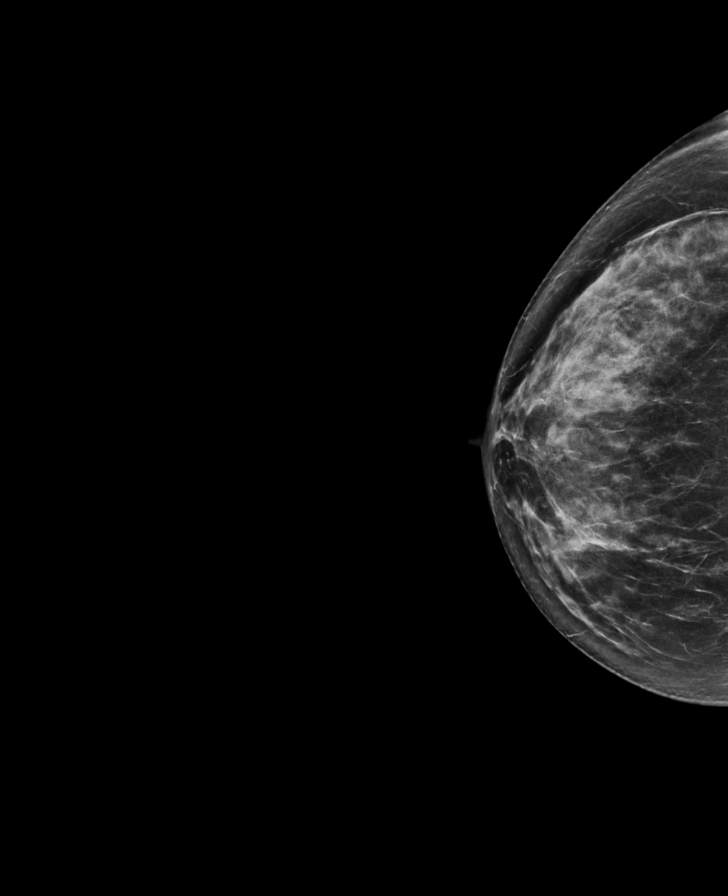

[L CC synth-2D]
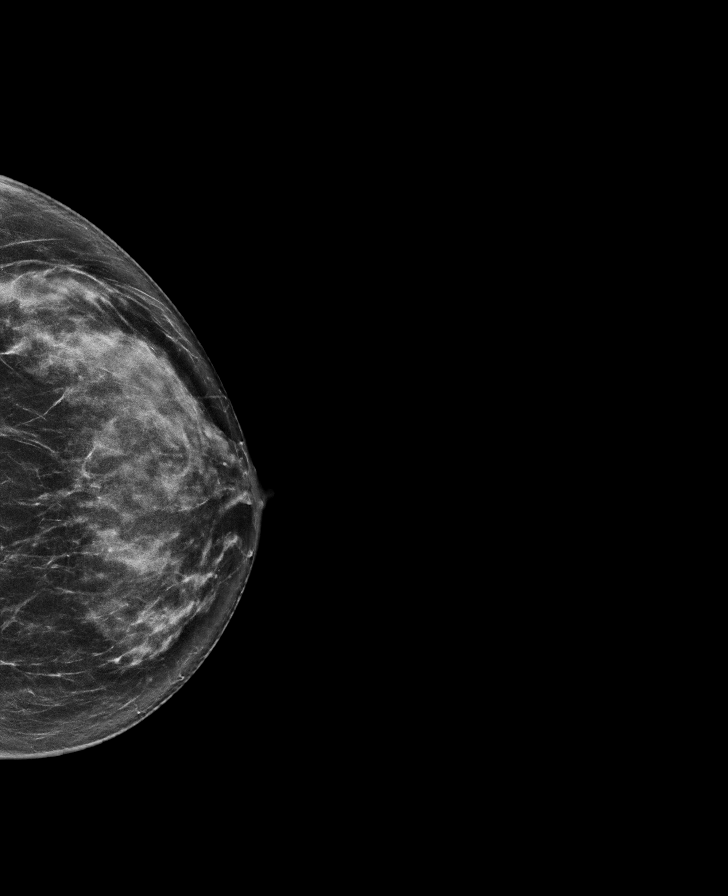

[R MLO synth-2D]
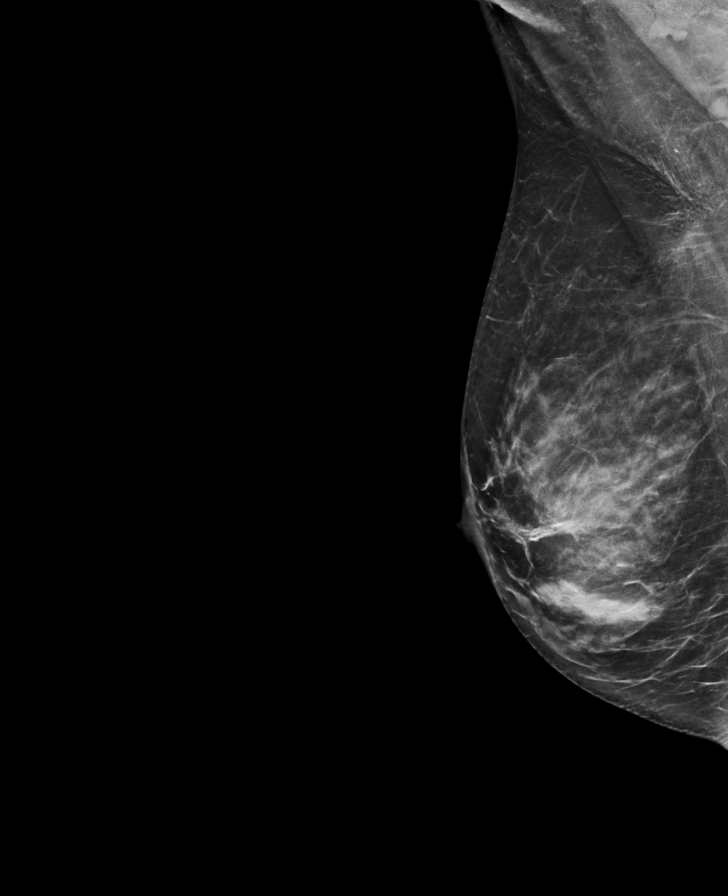

[L MLO synth-2D]
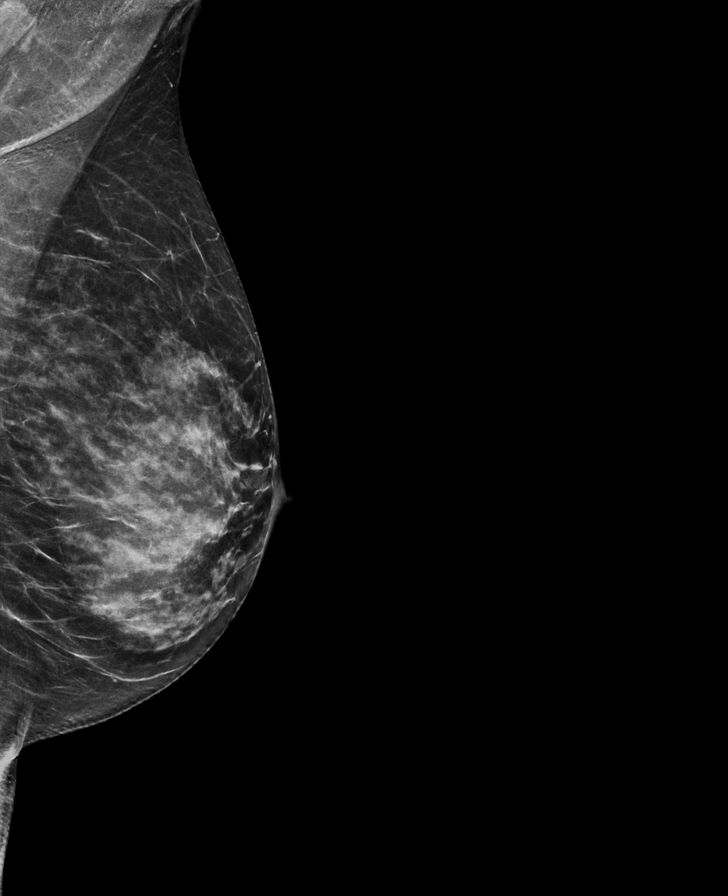

[R CC tomo · tomo slice 33/66.0]
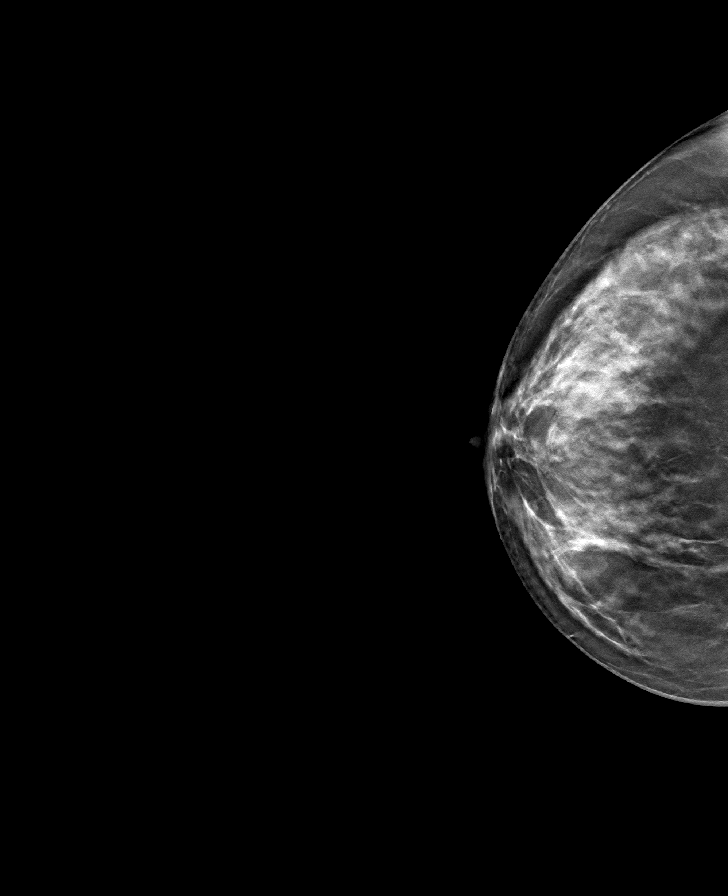

[R MLO tomo · tomo slice 35/69.0]
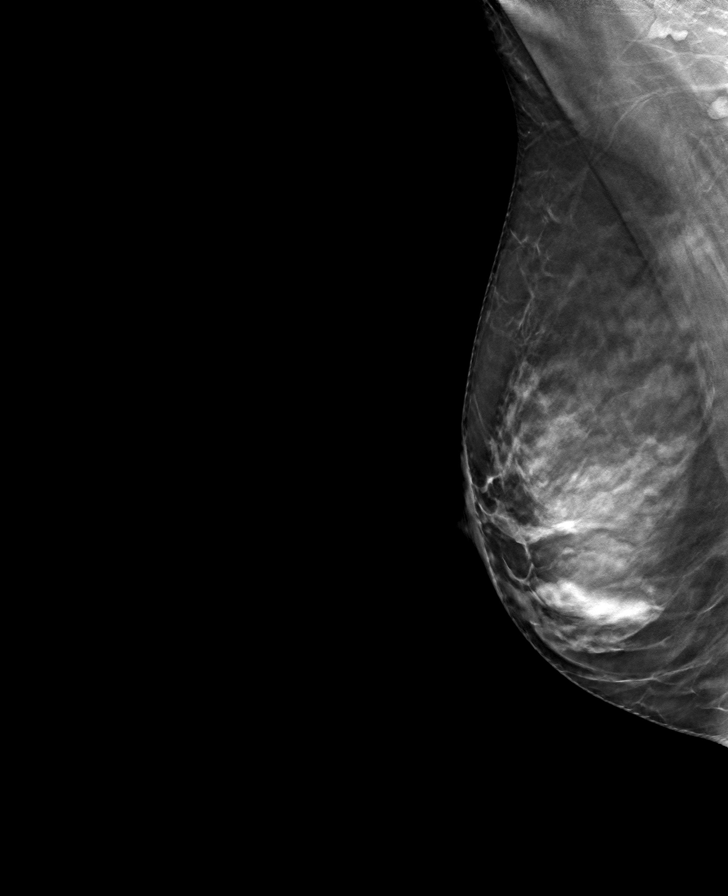

[L CC tomo · tomo slice 35/69.0]
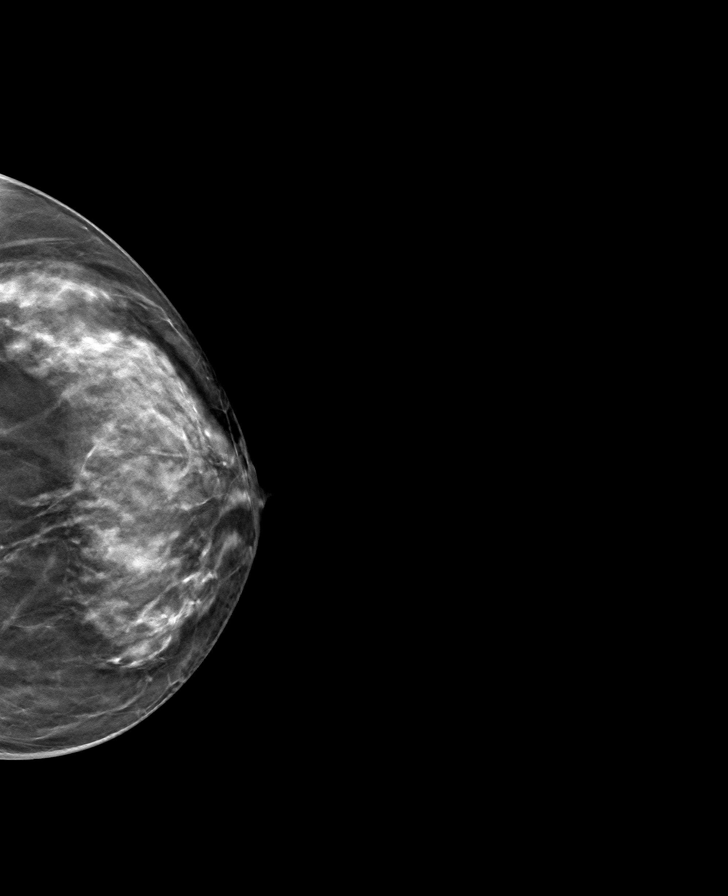

[L MLO tomo · tomo slice 33/64.0]
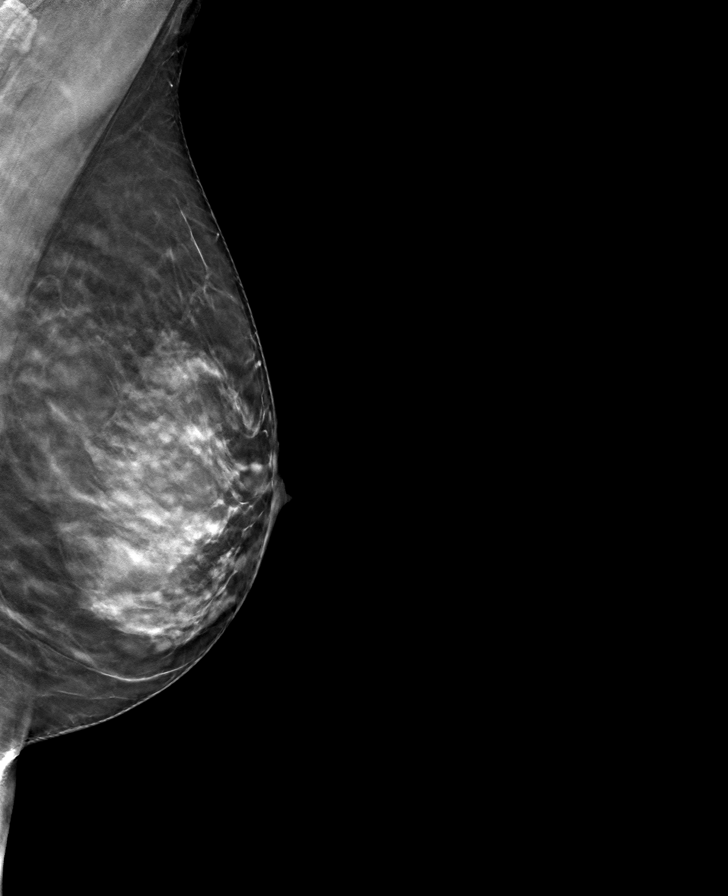

[8 of 24 positions shown; findings below may reference images not displayed]

ACR Breast Density Category c: The breast tissue is heterogeneously
dense, which may obscure small masses
FINDINGS: There are no findings suspicious for malignancy. Images were
processed with CAD.
IMPRESSION: No mammographic evidence of malignancy. A result letter of this
screening mammogram will be mailed directly to the patient.

RECOMMENDATION:
Screening mammogram in one year. (Code:EM-[id number])

BI-RADS CATEGORY  1: Negative.

## 2020-09-29 ENCOUNTER — Other Ambulatory Visit: Payer: Self-pay | Admitting: Physician Assistant

## 2020-09-29 DIAGNOSIS — R5382 Chronic fatigue, unspecified: Secondary | ICD-10-CM

## 2020-09-30 MED ORDER — MODAFINIL 100 MG PO TABS: 100.0000 mg | ORAL_TABLET | Freq: Every day | ORAL | 0 refills | Status: DC

## 2020-11-20 DIAGNOSIS — M5412 Radiculopathy, cervical region: Secondary | ICD-10-CM | POA: Diagnosis not present

## 2020-11-21 DIAGNOSIS — M5412 Radiculopathy, cervical region: Secondary | ICD-10-CM | POA: Diagnosis not present

## 2020-11-22 DIAGNOSIS — M5412 Radiculopathy, cervical region: Secondary | ICD-10-CM | POA: Diagnosis not present

## 2020-11-26 DIAGNOSIS — M5412 Radiculopathy, cervical region: Secondary | ICD-10-CM | POA: Diagnosis not present

## 2020-11-27 DIAGNOSIS — M5412 Radiculopathy, cervical region: Secondary | ICD-10-CM | POA: Diagnosis not present

## 2020-11-29 DIAGNOSIS — M5412 Radiculopathy, cervical region: Secondary | ICD-10-CM | POA: Diagnosis not present

## 2020-12-02 DIAGNOSIS — M5412 Radiculopathy, cervical region: Secondary | ICD-10-CM | POA: Diagnosis not present

## 2020-12-03 DIAGNOSIS — M5412 Radiculopathy, cervical region: Secondary | ICD-10-CM | POA: Diagnosis not present

## 2020-12-05 DIAGNOSIS — M5412 Radiculopathy, cervical region: Secondary | ICD-10-CM | POA: Diagnosis not present

## 2020-12-16 DIAGNOSIS — M5412 Radiculopathy, cervical region: Secondary | ICD-10-CM | POA: Diagnosis not present

## 2020-12-17 DIAGNOSIS — M5412 Radiculopathy, cervical region: Secondary | ICD-10-CM | POA: Diagnosis not present

## 2020-12-19 DIAGNOSIS — M5412 Radiculopathy, cervical region: Secondary | ICD-10-CM | POA: Diagnosis not present

## 2020-12-23 DIAGNOSIS — M5412 Radiculopathy, cervical region: Secondary | ICD-10-CM | POA: Diagnosis not present

## 2020-12-24 DIAGNOSIS — M5412 Radiculopathy, cervical region: Secondary | ICD-10-CM | POA: Diagnosis not present

## 2020-12-26 DIAGNOSIS — M5412 Radiculopathy, cervical region: Secondary | ICD-10-CM | POA: Diagnosis not present

## 2020-12-31 DIAGNOSIS — M5412 Radiculopathy, cervical region: Secondary | ICD-10-CM | POA: Diagnosis not present

## 2021-01-02 DIAGNOSIS — M5412 Radiculopathy, cervical region: Secondary | ICD-10-CM | POA: Diagnosis not present

## 2021-01-07 DIAGNOSIS — M5412 Radiculopathy, cervical region: Secondary | ICD-10-CM | POA: Diagnosis not present

## 2021-01-09 DIAGNOSIS — M5412 Radiculopathy, cervical region: Secondary | ICD-10-CM | POA: Diagnosis not present

## 2021-01-10 ENCOUNTER — Telehealth (INDEPENDENT_AMBULATORY_CARE_PROVIDER_SITE_OTHER): Payer: BC Managed Care – PPO | Admitting: Physician Assistant

## 2021-01-10 ENCOUNTER — Other Ambulatory Visit: Payer: Self-pay

## 2021-01-10 ENCOUNTER — Encounter: Payer: Self-pay | Admitting: Physician Assistant

## 2021-01-10 VITALS — Ht 67.0 in | Wt 127.0 lb

## 2021-01-10 DIAGNOSIS — F411 Generalized anxiety disorder: Secondary | ICD-10-CM

## 2021-01-10 DIAGNOSIS — R5382 Chronic fatigue, unspecified: Secondary | ICD-10-CM

## 2021-01-10 DIAGNOSIS — R1033 Periumbilical pain: Secondary | ICD-10-CM | POA: Insufficient documentation

## 2021-01-10 DIAGNOSIS — F5101 Primary insomnia: Secondary | ICD-10-CM | POA: Diagnosis not present

## 2021-01-10 DIAGNOSIS — F332 Major depressive disorder, recurrent severe without psychotic features: Secondary | ICD-10-CM | POA: Diagnosis not present

## 2021-01-10 MED ORDER — MODAFINIL 100 MG PO TABS: 100.0000 mg | ORAL_TABLET | Freq: Every day | ORAL | 0 refills | Status: DC

## 2021-01-10 MED ORDER — DULOXETINE HCL 20 MG PO CPEP: 20.0000 mg | ORAL_CAPSULE | Freq: Every day | ORAL | 0 refills | Status: DC

## 2021-01-10 NOTE — Progress Notes (Signed)
Wants to discuss cutting down on medications PHQ9 (5) -GAD7 (6) completed.

## 2021-01-10 NOTE — Progress Notes (Signed)
..Virtual Visit via Video Note  I connected with Carol Rodgers on 01/11/21 at  8:50 AM EDT by a video enabled telemedicine application and verified that I am speaking with the correct person using two identifiers.  Location: Patient: home Provider: clinic  .Marland KitchenParticipating in visit:  Patient: Carol Rodgers Provider: Iran Planas PA-C   I discussed the limitations of evaluation and management by telemedicine and the availability of in person appointments. The patient expressed understanding and agreed to proceed.  History of Present Illness: Pt is a 45 yo female with MDD, anxiety, CFS, insomnia who presents to the clinic via video to discuss medications.   She is doing really good and would like to come off some of her medications. No SI/HC. She feels like she has more energy. She has mostly good motivated days but hates taking all the medications.   She does mention last night sudden umbilical pain that was severe and lasted over an hour. She felt something hard when she put her finger in belly button but could not tolerate touching it. She took NsaID and ice pack and laid down and it went away. Never had anything like that before. No gas, constipation, diarrhea, vomiting or nausea.   .. Active Ambulatory Problems    Diagnosis Date Noted   Depression 09/15/2013   Sleeping difficulties 09/15/2013   Anxiety state 09/15/2013   Chronic fatigue 10/13/2013   Insomnia 04/05/2014   Hot flashes 09/13/2015   Dry skin 09/13/2015   No energy 05/31/2017   Hair loss 38/45/3646   Umbilical pain 80/32/1224   Resolved Ambulatory Problems    Diagnosis Date Noted   Acute non-recurrent maxillary sinusitis 09/23/2017   Past Medical History:  Diagnosis Date   History of stomach ulcers    Mental disorder    No pertinent past medical history        Observations/Objective: No acute distress Normal mood and appearance.   .. Depression screen Carol Rodgers 2/9 01/10/2021 04/17/2020 12/02/2018 03/22/2018  05/31/2017  Decreased Interest 1 2 1  0 1  Down, Depressed, Hopeless 1 1 0 0 1  PHQ - 2 Score 2 3 1  0 2  Altered sleeping 0 2 3 1 3   Tired, decreased energy 2 3 2 2 2   Change in appetite 0 0 0 0 1  Feeling bad or failure about yourself  1 1 0 0 1  Trouble concentrating 0 0 0 0 0  Moving slowly or fidgety/restless 0 0 0 0 0  Suicidal thoughts 0 0 0 0 0  PHQ-9 Score 5 9 6 3 9   Difficult doing work/chores Somewhat difficult Somewhat difficult Somewhat difficult Not difficult at all -   .. GAD 7 : Generalized Anxiety Score 01/10/2021 04/17/2020 12/02/2018 03/22/2018  Nervous, Anxious, on Edge 1 2 1 1   Control/stop worrying 1 1 1 1   Worry too much - different things 1 1 1 1   Trouble relaxing 1 1 1 1   Restless 0 1 1 0  Easily annoyed or irritable 1 2 1 1   Afraid - awful might happen 1 1 1  0  Total GAD 7 Score 6 9 7 5   Anxiety Difficulty Somewhat difficult Not difficult at all Somewhat difficult Somewhat difficult     Assessment and Plan: Marland KitchenMarland KitchenAkesha was seen today for follow-up.  Diagnoses and all orders for this visit:  Severe episode of recurrent major depressive disorder, without psychotic features ([place]) -     DULoxetine (CYMBALTA) 20 MG capsule; Take 1 capsule (20 mg total) by  mouth daily.  Anxiety state -     DULoxetine (CYMBALTA) 20 MG capsule; Take 1 capsule (20 mg total) by mouth daily.  Primary insomnia  Chronic fatigue -     modafinil (PROVIGIL) 100 MG tablet; Take 1 tablet (100 mg total) by mouth daily.  Umbilical pain  Concerned about stopping medication as medication could be why she is doing so good. Discussed taking tapering off any medication very slowly.  Decided to stay on provigil and wellbutrin with trazodone as needed for sleep and decrease cymbalta to 20mg  then after 3 weeks if doing good start taking every other day for 3 weeks then every 3 days for 3 weeks then stop.   Umbilical pain could be hernia. If this happens again go to ED if cannot reduce the  bulging or pain dow not stop.    Follow Up Instructions:    I discussed the assessment and treatment plan with the patient. The patient was provided an opportunity to ask questions and all were answered. The patient agreed with the plan and demonstrated an understanding of the instructions.   The patient was advised to call back or seek an in-person evaluation if the symptoms worsen or if the condition fails to improve as anticipated.    Iran Planas, PA-C

## 2021-01-14 DIAGNOSIS — M5412 Radiculopathy, cervical region: Secondary | ICD-10-CM | POA: Diagnosis not present

## 2021-01-16 DIAGNOSIS — M5412 Radiculopathy, cervical region: Secondary | ICD-10-CM | POA: Diagnosis not present

## 2021-01-17 ENCOUNTER — Other Ambulatory Visit: Payer: Self-pay | Admitting: Physician Assistant

## 2021-01-17 DIAGNOSIS — R5382 Chronic fatigue, unspecified: Secondary | ICD-10-CM

## 2021-01-27 ENCOUNTER — Other Ambulatory Visit: Payer: Self-pay | Admitting: Physician Assistant

## 2021-01-27 DIAGNOSIS — R5382 Chronic fatigue, unspecified: Secondary | ICD-10-CM

## 2021-02-24 ENCOUNTER — Other Ambulatory Visit: Payer: Self-pay | Admitting: Physician Assistant

## 2021-02-24 DIAGNOSIS — R5382 Chronic fatigue, unspecified: Secondary | ICD-10-CM

## 2021-02-25 MED ORDER — MODAFINIL 100 MG PO TABS: 100.0000 mg | ORAL_TABLET | Freq: Every day | ORAL | 0 refills | Status: DC

## 2021-02-27 ENCOUNTER — Other Ambulatory Visit: Payer: Self-pay | Admitting: Physician Assistant

## 2021-02-27 DIAGNOSIS — F411 Generalized anxiety disorder: Secondary | ICD-10-CM

## 2021-02-27 DIAGNOSIS — F332 Major depressive disorder, recurrent severe without psychotic features: Secondary | ICD-10-CM

## 2021-02-27 DIAGNOSIS — F5101 Primary insomnia: Secondary | ICD-10-CM

## 2021-03-06 ENCOUNTER — Other Ambulatory Visit: Payer: Self-pay | Admitting: Physician Assistant

## 2021-03-06 DIAGNOSIS — F411 Generalized anxiety disorder: Secondary | ICD-10-CM

## 2021-03-06 DIAGNOSIS — F332 Major depressive disorder, recurrent severe without psychotic features: Secondary | ICD-10-CM

## 2021-05-26 ENCOUNTER — Other Ambulatory Visit: Payer: Self-pay | Admitting: Physician Assistant

## 2021-05-26 DIAGNOSIS — R5382 Chronic fatigue, unspecified: Secondary | ICD-10-CM

## 2021-05-28 MED ORDER — MODAFINIL 100 MG PO TABS: 100.0000 mg | ORAL_TABLET | Freq: Every day | ORAL | 0 refills | Status: DC

## 2021-05-28 NOTE — Progress Notes (Signed)
LVM for patient to call back to get a follow up appt scheduled.

## 2021-05-28 NOTE — Progress Notes (Unsigned)
Please call pt to schedule appt.  No further refills until pt is seen.  T. Allan Minotti, CMA  

## 2021-06-02 ENCOUNTER — Ambulatory Visit: Payer: BC Managed Care – PPO | Admitting: Physician Assistant

## 2021-06-02 ENCOUNTER — Encounter: Payer: Self-pay | Admitting: Physician Assistant

## 2021-06-02 ENCOUNTER — Other Ambulatory Visit: Payer: Self-pay

## 2021-06-02 VITALS — BP 111/69 | HR 105 | Temp 98.6°F | Ht 67.0 in | Wt 123.0 lb

## 2021-06-02 DIAGNOSIS — F332 Major depressive disorder, recurrent severe without psychotic features: Secondary | ICD-10-CM

## 2021-06-02 DIAGNOSIS — F5101 Primary insomnia: Secondary | ICD-10-CM | POA: Diagnosis not present

## 2021-06-02 DIAGNOSIS — R5382 Chronic fatigue, unspecified: Secondary | ICD-10-CM

## 2021-06-02 DIAGNOSIS — F411 Generalized anxiety disorder: Secondary | ICD-10-CM | POA: Diagnosis not present

## 2021-06-02 MED ORDER — DULOXETINE HCL 20 MG PO CPEP: 20.0000 mg | ORAL_CAPSULE | Freq: Every day | ORAL | 0 refills | Status: DC

## 2021-06-02 MED ORDER — MODAFINIL 100 MG PO TABS: 100.0000 mg | ORAL_TABLET | Freq: Every day | ORAL | 0 refills | Status: DC

## 2021-06-02 NOTE — Progress Notes (Signed)
Subjective:    Patient ID: Carol Rodgers, female    DOB: [birth year], 45 y.o.   MRN: [id number]  HPI Pt is a 45 yo female with chronic fatigue, MDD, anxiety, insomnia who presents to the clinic for follow up on medications.   Overall provigil has helped a lot for chronic fatigue. She does feel like she has more energy. No problems or concerns.   Her depression and anxiety are controlled for the most part. She feels like she is a little more anxious right now preparing for a trip to [place] with the family and christmas. No SI/HC. She continues to wean off cymbalta.    .. Active Ambulatory Problems    Diagnosis Date Noted   Depression 09/15/2013   Sleeping difficulties 09/15/2013   Anxiety state 09/15/2013   Chronic fatigue 10/13/2013   Insomnia 04/05/2014   Hot flashes 09/13/2015   Dry skin 09/13/2015   No energy 05/31/2017   Hair loss 16/38/4536   Umbilical pain 46/80/3212   Resolved Ambulatory Problems    Diagnosis Date Noted   Acute non-recurrent maxillary sinusitis 09/23/2017   Past Medical History:  Diagnosis Date   History of stomach ulcers    Mental disorder    No pertinent past medical history     Review of Systems See HPI.     Objective:   Physical Exam Vitals reviewed.  Constitutional:      Appearance: Normal appearance.  HENT:     Head: Normocephalic.  Cardiovascular:     Rate and Rhythm: Normal rate and regular rhythm.     Pulses: Normal pulses.     Heart sounds: Normal heart sounds.  Pulmonary:     Effort: Pulmonary effort is normal.  Musculoskeletal:     Cervical back: No tenderness.  Lymphadenopathy:     Cervical: No cervical adenopathy.  Neurological:     General: No focal deficit present.     Mental Status: She is alert and oriented to person, place, and time.  Psychiatric:        Mood and Affect: Mood normal.  .. Depression screen [facility] 2/9 06/02/2021 01/10/2021 04/17/2020 12/02/2018 03/22/2018  Decreased Interest 1 1 2 1  0  Down,  Depressed, Hopeless 1 1 1  0 0  PHQ - 2 Score 2 2 3 1  0  Altered sleeping 1 0 2 3 1   Tired, decreased energy 1 2 3 2 2   Change in appetite 0 0 0 0 0  Feeling bad or failure about yourself  1 1 1  0 0  Trouble concentrating 0 0 0 0 0  Moving slowly or fidgety/restless 0 0 0 0 0  Suicidal thoughts 0 0 0 0 0  PHQ-9 Score 5 5 9 6 3   Difficult doing work/chores Somewhat difficult Somewhat difficult Somewhat difficult Somewhat difficult Not difficult at all   .Marland Kitchen GAD 7 : Generalized Anxiety Score 06/02/2021 01/10/2021 04/17/2020 12/02/2018  Nervous, Anxious, on Edge 2 1 2 1   Control/stop worrying 2 1 1 1   Worry too much - different things 2 1 1 1   Trouble relaxing 2 1 1 1   Restless 1 0 1 1  Easily annoyed or irritable 2 1 2 1   Afraid - awful might happen 1 1 1 1   Total GAD 7 Score 12 6 9 7   Anxiety Difficulty Somewhat difficult Somewhat difficult Not difficult at all Somewhat difficult            Assessment & Plan:  Marland KitchenMarland KitchenRoselene was seen today for  depression, anxiety, insomnia and chronic fatigue.  Diagnoses and all orders for this visit:  Severe episode of recurrent major depressive disorder, without psychotic features ([place]) -     DULoxetine (CYMBALTA) 20 MG capsule; Take 1 capsule (20 mg total) by mouth daily.  Anxiety state -     DULoxetine (CYMBALTA) 20 MG capsule; Take 1 capsule (20 mg total) by mouth daily.  Primary insomnia  Chronic fatigue -     modafinil (PROVIGIL) 100 MG tablet; Take 1 tablet (100 mg total) by mouth daily.   PHQ stable.  GAD is up but reports due to trip to [place] that she is nervous about and christmas stress.  Follow up as needed.  Refilled provigil. No concerns.  Follow up in 6 months.

## 2021-07-27 ENCOUNTER — Other Ambulatory Visit: Payer: Self-pay | Admitting: Physician Assistant

## 2021-07-27 DIAGNOSIS — F332 Major depressive disorder, recurrent severe without psychotic features: Secondary | ICD-10-CM

## 2021-07-27 DIAGNOSIS — F411 Generalized anxiety disorder: Secondary | ICD-10-CM

## 2021-08-27 ENCOUNTER — Other Ambulatory Visit: Payer: Self-pay | Admitting: Physician Assistant

## 2021-08-27 DIAGNOSIS — R5382 Chronic fatigue, unspecified: Secondary | ICD-10-CM

## 2021-08-27 NOTE — Telephone Encounter (Signed)
Last appt 06/02/2021  ?Last written 06/02/2021 #90 with no refill  ?

## 2021-10-13 ENCOUNTER — Other Ambulatory Visit: Payer: Self-pay | Admitting: Physician Assistant

## 2021-10-13 DIAGNOSIS — F411 Generalized anxiety disorder: Secondary | ICD-10-CM

## 2021-10-13 DIAGNOSIS — F332 Major depressive disorder, recurrent severe without psychotic features: Secondary | ICD-10-CM

## 2021-11-25 ENCOUNTER — Other Ambulatory Visit: Payer: Self-pay | Admitting: Physician Assistant

## 2021-11-25 DIAGNOSIS — R5382 Chronic fatigue, unspecified: Secondary | ICD-10-CM

## 2021-12-01 ENCOUNTER — Ambulatory Visit: Payer: BC Managed Care – PPO | Admitting: Physician Assistant

## 2021-12-01 ENCOUNTER — Encounter: Payer: Self-pay | Admitting: Physician Assistant

## 2021-12-01 VITALS — BP 114/81 | HR 118 | Ht 67.0 in | Wt 112.0 lb

## 2021-12-01 DIAGNOSIS — R1030 Lower abdominal pain, unspecified: Secondary | ICD-10-CM | POA: Diagnosis not present

## 2021-12-01 DIAGNOSIS — R5382 Chronic fatigue, unspecified: Secondary | ICD-10-CM

## 2021-12-01 DIAGNOSIS — Z13220 Encounter for screening for lipoid disorders: Secondary | ICD-10-CM

## 2021-12-01 DIAGNOSIS — Z79899 Other long term (current) drug therapy: Secondary | ICD-10-CM | POA: Diagnosis not present

## 2021-12-01 DIAGNOSIS — Z1329 Encounter for screening for other suspected endocrine disorder: Secondary | ICD-10-CM

## 2021-12-01 DIAGNOSIS — Z131 Encounter for screening for diabetes mellitus: Secondary | ICD-10-CM | POA: Diagnosis not present

## 2021-12-01 DIAGNOSIS — F332 Major depressive disorder, recurrent severe without psychotic features: Secondary | ICD-10-CM

## 2021-12-01 DIAGNOSIS — F411 Generalized anxiety disorder: Secondary | ICD-10-CM

## 2021-12-01 DIAGNOSIS — F5101 Primary insomnia: Secondary | ICD-10-CM

## 2021-12-01 LAB — POCT URINALYSIS DIP (CLINITEK)
Blood, UA: NEGATIVE
Glucose, UA: NEGATIVE mg/dL
Ketones, POC UA: NEGATIVE mg/dL
Leukocytes, UA: NEGATIVE
Nitrite, UA: NEGATIVE
Spec Grav, UA: >=1.03 — AB (ref 1.010–1.025)
Urobilinogen, UA: 0.2 E.U./dL
pH, UA: 5.5 (ref 5.0–8.0)

## 2021-12-01 MED ORDER — MODAFINIL 100 MG PO TABS: 100.0000 mg | ORAL_TABLET | Freq: Every day | ORAL | 1 refills | Status: DC

## 2021-12-01 MED ORDER — DULOXETINE HCL 20 MG PO CPEP: 20.0000 mg | ORAL_CAPSULE | Freq: Every day | ORAL | 1 refills | Status: DC

## 2021-12-01 MED ORDER — TRAZODONE HCL 50 MG PO TABS: ORAL_TABLET | ORAL | 3 refills | Status: DC

## 2021-12-01 NOTE — Patient Instructions (Addendum)
Stop wellbutrin Continue other medications Get u/s pelvis Get labs.

## 2021-12-01 NOTE — Progress Notes (Signed)
Established Patient Office Visit  Subjective   Patient ID: Carol Rodgers, female    DOB: May 07, 1976  Age: 46 y.o. MRN: [id number]  Chief Complaint  Patient presents with   Follow-up    HPI Pt is a 46 yo female with CFS, insomnia, MDD and GAD who presents to the clinic for follow up.   She does feel like anxiety is worse than depression right now. She is taking cymbalta every other day hoping to get off of it. She is sleeping well. She continues to have no energy.   She is also having some bilateral lower abdominal pain intermittently for the last 6 months. It seems to be worse during her menstrual cycle. She admits to some constipation. She at times feels a twinge of pain even in a cough. No urinary symptoms. One sexual partner. No vaginal discharge. Pap UTD.   Active Ambulatory Problems    Diagnosis Date Noted   Depression 09/15/2013   Sleeping difficulties 09/15/2013   Anxiety state 09/15/2013   Chronic fatigue 10/13/2013   Primary insomnia 04/05/2014   Hot flashes 09/13/2015   Dry skin 09/13/2015   No energy 05/31/2017   Hair loss 35/67/0141   Umbilical pain 08/27/3141   Severe episode of recurrent major depressive disorder, without psychotic features ([place]) 12/01/2021   Resolved Ambulatory Problems    Diagnosis Date Noted   Acute non-recurrent maxillary sinusitis 09/23/2017   Past Medical History:  Diagnosis Date   History of stomach ulcers    Mental disorder    No pertinent past medical history       ROS See HPI.    Objective:     BP 114/81   Pulse (!) 118   Ht '5\' 7"'$  (1.702 m)   Wt 112 lb (50.8 kg)   SpO2 99%   BMI 17.54 kg/m  BP Readings from Last 3 Encounters:  12/01/21 114/81  06/02/21 111/69  11/14/19 109/74   Wt Readings from Last 3 Encounters:  12/01/21 112 lb (50.8 kg)  06/02/21 123 lb (55.8 kg)  01/10/21 127 lb (57.6 kg)    ..    12/01/2021    9:50 AM 06/02/2021    2:15 PM 01/10/2021    8:42 AM 04/17/2020    1:18 PM 12/02/2018     2:09 PM  Depression screen PHQ 2/9  Decreased Interest '2 1 1 2 1  '$ Down, Depressed, Hopeless  '1 1 1 '$ 0  PHQ - 2 Score '2 2 2 3 1  '$ Altered sleeping 3 1 0 2 3  Tired, decreased energy '2 1 2 3 2  '$ Change in appetite 1 0 0 0 0  Feeling bad or failure about yourself  '1 1 1 1 '$ 0  Trouble concentrating 2 0 0 0 0  Moving slowly or fidgety/restless 1 0 0 0 0  Suicidal thoughts 0 0 0 0 0  PHQ-9 Score '12 5 5 9 6  '$ Difficult doing work/chores Not difficult at all Somewhat difficult Somewhat difficult Somewhat difficult Somewhat difficult   ..    12/01/2021    9:51 AM 06/02/2021    2:15 PM 01/10/2021    8:44 AM 04/17/2020    1:17 PM  GAD 7 : Generalized Anxiety Score  Nervous, Anxious, on Edge '3 2 1 2  '$ Control/stop worrying '2 2 1 1  '$ Worry too much - different things '2 2 1 1  '$ Trouble relaxing '2 2 1 1  '$ Restless  1 0 1  Easily annoyed or irritable 1  $'2 1 2  'W$ Afraid - awful might happen '1 1 1 1  '$ Total GAD 7 Score  '12 6 9  '$ Anxiety Difficulty Somewhat difficult Somewhat difficult Somewhat difficult Not difficult at all      Physical Exam Vitals reviewed.  Constitutional:      Appearance: Normal appearance.  HENT:     Head: Normocephalic.  Neck:     Vascular: No carotid bruit.  Cardiovascular:     Rate and Rhythm: Normal rate and regular rhythm.     Pulses: Normal pulses.     Heart sounds: Normal heart sounds.  Pulmonary:     Effort: Pulmonary effort is normal.     Breath sounds: Normal breath sounds.  Abdominal:     General: Bowel sounds are normal. There is no distension.     Palpations: Abdomen is soft. There is no mass.     Tenderness: There is abdominal tenderness. There is no right CVA tenderness, left CVA tenderness, guarding or rebound.     Hernia: No hernia is present.     Comments: Lower bilateral and suprapubic tenderness to palpation.   Musculoskeletal:     Right lower leg: No edema.     Left lower leg: No edema.  Lymphadenopathy:     Cervical: No cervical adenopathy.   Neurological:     General: No focal deficit present.     Mental Status: She is alert and oriented to person, place, and time.  Psychiatric:        Mood and Affect: Mood normal.   .. Results for orders placed or performed in visit on 12/01/21  POCT URINALYSIS DIP (CLINITEK)  Result Value Ref Range   Color, UA straw (A) yellow   Clarity, UA clear clear   Glucose, UA negative negative mg/dL   Bilirubin, UA small (A) negative   Ketones, POC UA negative negative mg/dL   Spec Grav, UA >=1.030 (A) 1.010 - 1.025   Blood, UA negative negative   pH, UA 5.5 5.0 - 8.0   POC PROTEIN,UA trace negative, trace   Urobilinogen, UA 0.2 0.2 or 1.0 E.U./dL   Nitrite, UA Negative Negative   Leukocytes, UA Negative Negative         Assessment & Plan:  .Marland KitchenMarland KitchenKhai was seen today for follow-up.  Diagnoses and all orders for this visit:  Chronic fatigue -     TSH -     modafinil (PROVIGIL) 100 MG tablet; Take 1 tablet (100 mg total) by mouth daily.  Lipid screening -     Lipid Panel w/reflex Direct LDL  Diabetes mellitus screening -     COMPLETE METABOLIC PANEL WITH GFR  Medication management -     TSH -     Lipid Panel w/reflex Direct LDL -     COMPLETE METABOLIC PANEL WITH GFR -     CBC with Differential/Platelet  Thyroid disorder screen -     TSH  Severe episode of recurrent major depressive disorder, without psychotic features ([facility]) -     DULoxetine (CYMBALTA) 20 MG capsule; Take 1 capsule (20 mg total) by mouth daily.  Anxiety state -     DULoxetine (CYMBALTA) 20 MG capsule; Take 1 capsule (20 mg total) by mouth daily.  Primary insomnia -     traZODone (DESYREL) 50 MG tablet; TAKE 1/2 TO 1 TABLET BY  MOUTH AT BEDTIME AS NEEDED  FOR SLEEP  Lower abdominal pain -     US Pelvic Complete With Transvaginal; Future -  POCT URINALYSIS DIP (CLINITEK) -     GC Probe amplification, urine   PHQ/GAD not to goal ? If wellbutrin causing anxiety to worsen Start cymbalta daily and  stop wellbutrin Continue trazodone for sleep Continue progvigil for CFS  Get pelvic ultrasound Get labs today UA positive for bilirubin STD testing ordered.  Discussed constipation, consider fruits/veggies/miralax/hydration    Return in about 3 months (around 03/03/2022), or if symptoms worsen or fail to improve.    Iran Planas, PA-C

## 2021-12-02 ENCOUNTER — Other Ambulatory Visit: Payer: Self-pay | Admitting: Physician Assistant

## 2021-12-02 DIAGNOSIS — R1030 Lower abdominal pain, unspecified: Secondary | ICD-10-CM | POA: Diagnosis not present

## 2021-12-02 LAB — CBC WITH DIFFERENTIAL/PLATELET
Absolute Monocytes: 254 cells/uL (ref 200–950)
Basophils Absolute: 31 cells/uL (ref 0–200)
Basophils Relative: 0.8 %
Eosinophils Absolute: 160 cells/uL (ref 15–500)
Eosinophils Relative: 4.1 %
HCT: 37.8 % (ref 35.0–45.0)
Hemoglobin: 12.8 g/dL (ref 11.7–15.5)
Lymphs Abs: 1006 cells/uL (ref 850–3900)
MCH: 30 pg (ref 27.0–33.0)
MCHC: 33.9 g/dL (ref 32.0–36.0)
MCV: 88.5 fL (ref 80.0–100.0)
MPV: 11.5 fL (ref 7.5–12.5)
Monocytes Relative: 6.5 %
Neutro Abs: 2449 cells/uL (ref 1500–7800)
Neutrophils Relative %: 62.8 %
Platelets: 233 10*3/uL (ref 140–400)
RBC: 4.27 10*6/uL (ref 3.80–5.10)
RDW: 12.6 % (ref 11.0–15.0)
Total Lymphocyte: 25.8 %
WBC: 3.9 10*3/uL (ref 3.8–10.8)

## 2021-12-02 LAB — LIPID PANEL W/REFLEX DIRECT LDL
Cholesterol: 185 mg/dL (ref <200)
HDL: 50 mg/dL (ref >=50)
LDL Cholesterol (Calc): 109 mg/dL (calc) — ABNORMAL HIGH
Non-HDL Cholesterol (Calc): 135 mg/dL (calc) — ABNORMAL HIGH (ref <130)
Total CHOL/HDL Ratio: 3.7 (calc) (ref <5.0)
Triglycerides: 150 mg/dL — ABNORMAL HIGH (ref <150)

## 2021-12-02 LAB — COMPLETE METABOLIC PANEL WITH GFR
AG Ratio: 1.8 (calc) (ref 1.0–2.5)
ALT: 6 U/L (ref 6–29)
AST: 10 U/L (ref 10–30)
Albumin: 4.4 g/dL (ref 3.6–5.1)
Alkaline phosphatase (APISO): 41 U/L (ref 31–125)
BUN: 15 mg/dL (ref 7–25)
CO2: 25 mmol/L (ref 20–32)
Calcium: 9.4 mg/dL (ref 8.6–10.2)
Chloride: 106 mmol/L (ref 98–110)
Creat: 0.99 mg/dL (ref 0.50–0.99)
Globulin: 2.4 g/dL (calc) (ref 1.9–3.7)
Glucose, Bld: 81 mg/dL (ref 65–139)
Potassium: 4.5 mmol/L (ref 3.5–5.3)
Sodium: 139 mmol/L (ref 135–146)
Total Bilirubin: 0.8 mg/dL (ref 0.2–1.2)
Total Protein: 6.8 g/dL (ref 6.1–8.1)
eGFR: 73 mL/min/{1.73_m2} (ref >=60)

## 2021-12-02 LAB — TSH: TSH: 1.99 mIU/L

## 2021-12-02 NOTE — Progress Notes (Signed)
TG are better.  LDL, bad cholesterol, is up some. Optimal is under 100.  Your HDL, good cholesterol, is great.  Avoid fatty and fried foods.  Thyroid looks great.  Kidney, liver, glucose look great.  No anemia and WBC looks great.

## 2021-12-04 ENCOUNTER — Ambulatory Visit (INDEPENDENT_AMBULATORY_CARE_PROVIDER_SITE_OTHER): Payer: BC Managed Care – PPO

## 2021-12-04 DIAGNOSIS — N926 Irregular menstruation, unspecified: Secondary | ICD-10-CM | POA: Diagnosis not present

## 2021-12-04 DIAGNOSIS — N941 Unspecified dyspareunia: Secondary | ICD-10-CM | POA: Diagnosis not present

## 2021-12-04 DIAGNOSIS — R1030 Lower abdominal pain, unspecified: Secondary | ICD-10-CM | POA: Diagnosis not present

## 2021-12-04 DIAGNOSIS — N888 Other specified noninflammatory disorders of cervix uteri: Secondary | ICD-10-CM | POA: Diagnosis not present

## 2021-12-04 LAB — SPECIMEN STATUS REPORT

## 2021-12-05 NOTE — Progress Notes (Signed)
Normal pelvic ultrasound. Right ovary is a little bigger volume but not concerning.

## 2021-12-07 LAB — SPECIMEN STATUS REPORT

## 2021-12-07 LAB — GC/CHLAMYDIA PROBE AMP
Chlamydia trachomatis, NAA: NEGATIVE
Neisseria Gonorrhoeae by PCR: NEGATIVE

## 2021-12-08 NOTE — Progress Notes (Signed)
Negative for STD.

## 2021-12-09 ENCOUNTER — Telehealth: Payer: Self-pay

## 2021-12-09 NOTE — Telephone Encounter (Addendum)
Initiated Prior authorization PFX:[id number] '100MG'$  tablets Via: Covermymeds Case/Key:[id number] Status: approved  as of 12/09/21 Reason:approved through 06/10/2022 Notified Pt via: Mychart

## 2022-01-06 ENCOUNTER — Other Ambulatory Visit: Payer: Self-pay | Admitting: Physician Assistant

## 2022-01-06 DIAGNOSIS — F332 Major depressive disorder, recurrent severe without psychotic features: Secondary | ICD-10-CM

## 2022-01-06 DIAGNOSIS — F411 Generalized anxiety disorder: Secondary | ICD-10-CM

## 2022-01-30 ENCOUNTER — Other Ambulatory Visit: Payer: Self-pay | Admitting: Physician Assistant

## 2022-01-30 DIAGNOSIS — F411 Generalized anxiety disorder: Secondary | ICD-10-CM

## 2022-01-30 DIAGNOSIS — F332 Major depressive disorder, recurrent severe without psychotic features: Secondary | ICD-10-CM

## 2022-03-26 DIAGNOSIS — M9903 Segmental and somatic dysfunction of lumbar region: Secondary | ICD-10-CM | POA: Diagnosis not present

## 2022-03-26 DIAGNOSIS — M9904 Segmental and somatic dysfunction of sacral region: Secondary | ICD-10-CM | POA: Diagnosis not present

## 2022-03-26 DIAGNOSIS — M5417 Radiculopathy, lumbosacral region: Secondary | ICD-10-CM | POA: Diagnosis not present

## 2022-03-26 DIAGNOSIS — M5137 Other intervertebral disc degeneration, lumbosacral region: Secondary | ICD-10-CM | POA: Diagnosis not present

## 2022-04-03 ENCOUNTER — Other Ambulatory Visit: Payer: Self-pay | Admitting: Physician Assistant

## 2022-04-03 DIAGNOSIS — F5101 Primary insomnia: Secondary | ICD-10-CM

## 2022-05-15 ENCOUNTER — Telehealth: Payer: Self-pay | Admitting: Gastroenterology

## 2022-05-15 NOTE — Telephone Encounter (Signed)
Inbound call from patient stating that she wanted to make an appointment for having issues with abd pain and IBS symptoms. Patient was scheduled for 12/20 at 10:00 with [facility] and is requesting a call back to discuss her symptoms and also to see if there is anyway she can be seen sooner. Please advise.

## 2022-05-15 NOTE — Telephone Encounter (Signed)
Pt reports she has had diarrhea 2  to 3 times per day for the last 2 months. Pt also reports right sided abdominal pain by her rib cage. Appointment moved to 06/04/22 at 1:30 pm. Pt verbalized understanding.

## 2022-05-26 ENCOUNTER — Other Ambulatory Visit: Payer: Self-pay | Admitting: Physician Assistant

## 2022-05-26 DIAGNOSIS — R5382 Chronic fatigue, unspecified: Secondary | ICD-10-CM

## 2022-05-26 NOTE — Telephone Encounter (Signed)
Last appt 12/01/2021 - told to follow up in 3 months No appt scheduled. Please reach out to schedule an appt for patient.

## 2022-05-26 NOTE — Telephone Encounter (Signed)
Called patient and patient contact (Spouse) and the phone numbers doesn't work at this time. Tvt

## 2022-05-26 NOTE — Telephone Encounter (Signed)
Please reach back out when phones are working here again, thanks.

## 2022-05-27 ENCOUNTER — Telehealth: Payer: Self-pay

## 2022-05-27 NOTE — Telephone Encounter (Signed)
Patient was scheduled for 12/4, rescheduled patient to 12/15 being that [name] is currently out of office, patient would like to see if she could get med refills MODAFINIL 100 MG TABLET until her appointment on 12/15, please advise, thanks!

## 2022-05-27 NOTE — Telephone Encounter (Signed)
Already filled earlier today.

## 2022-05-27 NOTE — Telephone Encounter (Signed)
Patient scheduled 06/01/2022, please advise on refill.

## 2022-05-27 NOTE — Telephone Encounter (Signed)
Scheduled for 06/01/22 @ 9:40.  tvt

## 2022-06-01 ENCOUNTER — Ambulatory Visit: Payer: BC Managed Care – PPO | Admitting: Physician Assistant

## 2022-06-04 ENCOUNTER — Ambulatory Visit: Payer: BC Managed Care – PPO | Admitting: Nurse Practitioner

## 2022-06-04 ENCOUNTER — Other Ambulatory Visit (INDEPENDENT_AMBULATORY_CARE_PROVIDER_SITE_OTHER): Payer: BC Managed Care – PPO

## 2022-06-04 ENCOUNTER — Encounter: Payer: Self-pay | Admitting: Nurse Practitioner

## 2022-06-04 VITALS — BP 116/60 | HR 107 | Ht 67.0 in | Wt 118.8 lb

## 2022-06-04 DIAGNOSIS — R1011 Right upper quadrant pain: Secondary | ICD-10-CM

## 2022-06-04 DIAGNOSIS — G8929 Other chronic pain: Secondary | ICD-10-CM

## 2022-06-04 DIAGNOSIS — R197 Diarrhea, unspecified: Secondary | ICD-10-CM | POA: Diagnosis not present

## 2022-06-04 DIAGNOSIS — R1031 Right lower quadrant pain: Secondary | ICD-10-CM | POA: Diagnosis not present

## 2022-06-04 LAB — CBC WITH DIFFERENTIAL/PLATELET
Basophils Absolute: 0 10*3/uL (ref 0.0–0.1)
Basophils Relative: 0.6 % (ref 0.0–3.0)
Eosinophils Absolute: 0.1 10*3/uL (ref 0.0–0.7)
Eosinophils Relative: 1.4 % (ref 0.0–5.0)
HCT: 35.9 % — ABNORMAL LOW (ref 36.0–46.0)
Hemoglobin: 12.4 g/dL (ref 12.0–15.0)
Lymphocytes Relative: 16.6 % (ref 12.0–46.0)
Lymphs Abs: 1 10*3/uL (ref 0.7–4.0)
MCHC: 34.4 g/dL (ref 30.0–36.0)
MCV: 85.2 fl (ref 78.0–100.0)
Monocytes Absolute: 0.3 10*3/uL (ref 0.1–1.0)
Monocytes Relative: 4.9 % (ref 3.0–12.0)
Neutro Abs: 4.6 10*3/uL (ref 1.4–7.7)
Neutrophils Relative %: 76.5 % (ref 43.0–77.0)
Platelets: 273 10*3/uL (ref 150.0–400.0)
RBC: 4.22 Mil/uL (ref 3.87–5.11)
RDW: 12.8 % (ref 11.5–15.5)
WBC: 6.1 10*3/uL (ref 4.0–10.5)

## 2022-06-04 LAB — COMPREHENSIVE METABOLIC PANEL
ALT: 12 U/L (ref 0–35)
AST: 15 U/L (ref 0–37)
Albumin: 4.3 g/dL (ref 3.5–5.2)
Alkaline Phosphatase: 46 U/L (ref 39–117)
BUN: 11 mg/dL (ref 6–23)
CO2: 27 mEq/L (ref 19–32)
Calcium: 8.5 mg/dL (ref 8.4–10.5)
Chloride: 101 mEq/L (ref 96–112)
Creatinine, Ser: 0.96 mg/dL (ref 0.40–1.20)
GFR: 72.16 mL/min (ref >60.00)
Glucose, Bld: 91 mg/dL (ref 70–99)
Potassium: 3.6 mEq/L (ref 3.5–5.1)
Sodium: 135 mEq/L (ref 135–145)
Total Bilirubin: 0.6 mg/dL (ref 0.2–1.2)
Total Protein: 7 g/dL (ref 6.0–8.3)

## 2022-06-04 LAB — SEDIMENTATION RATE: Sed Rate: 7 mm/hr (ref 0–20)

## 2022-06-04 LAB — HIGH SENSITIVITY CRP: CRP, High Sensitivity: 1.11 mg/L (ref 0.000–5.000)

## 2022-06-04 MED ORDER — DICYCLOMINE HCL 10 MG PO CAPS: 10.0000 mg | ORAL_CAPSULE | Freq: Three times a day (TID) | ORAL | 0 refills | Status: DC | PRN

## 2022-06-04 NOTE — Progress Notes (Signed)
+    06/04/2022 Carol Rodgers [id number] 1975-11-30   Chief Complaint: Diarrhea   History of Present Illness: Carol Rodgers is a 46 year old female with a past medical history of anxiety, depression, chronic fatigue and a sessile serrated colon polyp. She presents today for further evaluation regarding diarrhea which started 3 months ago.  She describes passing 2-3 nonbloody watery or mud like diarrhea bowel movements daily with associated with intermittent RUQ pain.  She less frequently has RLQ pain.  No nausea or vomiting.  No antibiotics within the past 6 months.  She has a prior history of alternating constipation and diarrhea.  In the past, she could go up to 5 days without passing a bowel movement then developed bloat followed by few days of diarrhea.  She drank head-nodding a few days ago and the next day she passed a fairly formed stool.  She is lost less than 5 pounds over the past few months.  She has night sweats a few times weekly for the past few years which she has previously addressed with her PCP.  Family history of IBD.  She stated several of her grandmother's siblings had colon cancer.  She underwent a colonoscopy by Dr. Bryan Lemma 11/14/2019 due to having alternating bowel pattern and rectal pain which identified 1 sessile serrated polyp removed from the cecum, diverticulosis in the ascending colon and the sigmoid colon was tortuous.  She was advised to repeat a colonoscopy in 5 years.     Latest Ref Rng & Units 12/01/2021   12:00 AM 11/10/2019   11:10 AM 03/22/2018   11:39 AM  CBC  WBC 3.8 - 10.8 Thousand/uL 3.9  4.3  4.1   Hemoglobin 11.7 - 15.5 g/dL 12.8  13.1  12.2   Hematocrit 35.0 - 45.0 % 37.8  38.0  35.3   Platelets 140 - 400 Thousand/uL 233  222.0  201         Latest Ref Rng & Units 12/01/2021   12:00 AM 11/10/2019   11:10 AM 03/22/2018   11:39 AM  CMP  Glucose 65 - 139 mg/dL 81  91  83   BUN 7 - 25 mg/dL '15  13  11   '$ Creatinine 0.50 - 0.99 mg/dL 0.99  1.04  1.00    Sodium 135 - 146 mmol/L 139  135  138   Potassium 3.5 - 5.3 mmol/L 4.5  3.8  3.8   Chloride 98 - 110 mmol/L 106  103  106   CO2 20 - 32 mmol/L '25  25  22   '$ Calcium 8.6 - 10.2 mg/dL 9.4  8.9  9.1   Total Protein 6.1 - 8.1 g/dL 6.8   6.4   Total Bilirubin 0.2 - 1.2 mg/dL 0.8   0.9   AST 10 - 30 U/L 10   12   ALT 6 - 29 U/L 6   7      Colonoscopy 11/14/2019 by 2 Cirigliano: - One 4 mm polyp in the cecum, removed with a cold snare. Resected and retrieved. - Diverticulosis in the ascending colon. - Tortuous sigmoid colon. - The distal rectum and anal verge are normal on retroflexion view. - The examined portion of the ileum was normal. - 5 year colonoscopy recall - SESSILE SERRATED POLYP (2 FRAGMENTS) - MULTIPLE FRAGMENTS OF BENIGN COLONIC MUCOSA - NO HIGH GRADE DYSPLASIA OR MALIGNANCY IDENTIFIED  Current Outpatient Medications on File Prior to Visit  Medication Sig Dispense Refill   DULoxetine (CYMBALTA) 20 MG  capsule TAKE 1 CAPSULE BY MOUTH DAILY 90 capsule 1   modafinil (PROVIGIL) 100 MG tablet TAKE 1 TABLET BY MOUTH DAILY 30 tablet 0   traZODone (DESYREL) 50 MG tablet TAKE 1/2 TO 1 TABLET BY  MOUTH AT BEDTIME AS NEEDED  FOR SLEEP 90 tablet 0   No current facility-administered medications on file prior to visit.   Allergies  Allergen Reactions   Viibryd [Vilazodone Hcl] Other (See Comments)    Severe anger   Current Medications, Allergies, Past Medical History, Past Surgical History, Family History and Social History were reviewed in [employer] record.  Review of Systems:   Constitutional: See HPI. Respiratory: Negative for shortness of breath.   Cardiovascular: Negative for chest pain, palpitations and leg swelling.  Gastrointestinal: See HPI.  Musculoskeletal: Negative for back pain or muscle aches.  Neurological: Negative for dizziness, headaches or paresthesias.   Physical Exam: Wt Readings from Last 3 Encounters:  06/04/22 118 lb 12.8 oz  (53.9 kg)  12/01/21 112 lb (50.8 kg)  06/02/21 123 lb (55.8 kg)    General: 46 year old female in no acute distress. Head: Normocephalic and atraumatic. Eyes: No scleral icterus. Conjunctiva pink . Ears: Normal auditory acuity. Mouth: Dentition intact. No ulcers or lesions.  Lungs: Clear throughout to auscultation. Heart: Regular rate and rhythm, no murmur. Abdomen: Soft, nondistended.  Mild tenderness to the RUQ and RLQ without rebound or guarding.  No masses or hepatomegaly. Normal bowel sounds x 4 quadrants.  Rectal: Deferred. Musculoskeletal: Symmetrical with no gross deformities. Extremities: No edema. Neurological: Alert oriented x 4. No focal deficits.  Psychological: Alert and cooperative. Normal mood and affect  Assessment and Recommendations:  70) 46 year old female with diarrhea and RUQ pain x 3 months, intermittent RLQ pain.  Colonoscopy 10/2019 without evidence of colitis. -Pathogen panel, CRP, sed rate, CBC, CMP, TTG and IgA level -Check fecal calprotectin level if GI pathogen panel negative and if this test is covered by her insurance -CTAP to assess for colitis/diverticulitis if RUQ/RLQ pain worsens  -She may require a diagnostic colonoscopy to rule out colitis/IBD -Push fluids, bland diet -Dicyclomine 10 mg 1 p.o. every 8 hours as needed for abdominal pain -Patient to contact office if symptoms worsen  2) Ascending diverticulosis per colonoscopy 11/14/2019  3) 1 sessile serrated polyp removed from the cecum per colonoscopy 10/2019 -Next colon polyp surveillance colonoscopy due 10/2024   Today's encounter was 25 minutes which included precharting, chart/result review, history/exam, face-to-face time used for counseling, formulating a treatment plan with follow-up and documentation.

## 2022-06-04 NOTE — Patient Instructions (Signed)
_______________________________________________________  If you are age 44 or older, your body mass index should be between 23-30. Your Body mass index is 18.61 kg/m. If this is out of the aforementioned range listed, please consider follow up with your Primary Care Provider.  If you are age 71 or younger, your body mass index should be between 19-25. Your Body mass index is 18.61 kg/m. If this is out of the aformentioned range listed, please consider follow up with your Primary Care Provider.   ________________________________________________________  The [facility] providers would like to encourage you to use [facility] to communicate with providers for non-urgent requests or questions.  Due to long hold times on the telephone, sending your provider a message by [facility] may be a faster and more efficient way to get a response.  Please allow 48 business hours for a response.  Please remember that this is for non-urgent requests.  _______________________________________________________  Your provider has requested that you go to the basement level for lab work before leaving today. Press "B" on the elevator. The lab is located at the first door on the left as you exit the elevator.  We have sent the following medications to your pharmacy for you to pick up at your convenience:  Dicyclomine  Contact the office is symptoms worsen

## 2022-06-05 ENCOUNTER — Other Ambulatory Visit: Payer: BC Managed Care – PPO

## 2022-06-05 DIAGNOSIS — R1011 Right upper quadrant pain: Secondary | ICD-10-CM | POA: Diagnosis not present

## 2022-06-05 DIAGNOSIS — R197 Diarrhea, unspecified: Secondary | ICD-10-CM

## 2022-06-05 DIAGNOSIS — G8929 Other chronic pain: Secondary | ICD-10-CM

## 2022-06-05 DIAGNOSIS — R1031 Right lower quadrant pain: Secondary | ICD-10-CM | POA: Diagnosis not present

## 2022-06-05 LAB — IGA: Immunoglobulin A: 169 mg/dL (ref 47–310)

## 2022-06-05 LAB — TISSUE TRANSGLUTAMINASE, IGA: (tTG) Ab, IgA: <1 U/mL

## 2022-06-08 NOTE — Progress Notes (Signed)
Agree with the assessment and plan as outlined by Colleen Kennedy-Smith, NP.   Bernette Seeman, DO, FACG [facility]   

## 2022-06-09 LAB — GI PROFILE, STOOL, PCR

## 2022-06-10 ENCOUNTER — Other Ambulatory Visit: Payer: Self-pay

## 2022-06-10 DIAGNOSIS — R197 Diarrhea, unspecified: Secondary | ICD-10-CM

## 2022-06-11 ENCOUNTER — Other Ambulatory Visit: Payer: Self-pay | Admitting: Physician Assistant

## 2022-06-11 DIAGNOSIS — F5101 Primary insomnia: Secondary | ICD-10-CM

## 2022-06-12 ENCOUNTER — Ambulatory Visit (INDEPENDENT_AMBULATORY_CARE_PROVIDER_SITE_OTHER): Payer: BC Managed Care – PPO

## 2022-06-12 ENCOUNTER — Ambulatory Visit: Payer: BC Managed Care – PPO | Admitting: Physician Assistant

## 2022-06-12 ENCOUNTER — Encounter: Payer: Self-pay | Admitting: Physician Assistant

## 2022-06-12 VITALS — BP 111/83 | HR 73 | Ht 67.0 in | Wt 118.0 lb

## 2022-06-12 DIAGNOSIS — R5382 Chronic fatigue, unspecified: Secondary | ICD-10-CM | POA: Diagnosis not present

## 2022-06-12 DIAGNOSIS — F411 Generalized anxiety disorder: Secondary | ICD-10-CM

## 2022-06-12 DIAGNOSIS — R0781 Pleurodynia: Secondary | ICD-10-CM

## 2022-06-12 DIAGNOSIS — F332 Major depressive disorder, recurrent severe without psychotic features: Secondary | ICD-10-CM | POA: Diagnosis not present

## 2022-06-12 DIAGNOSIS — M419 Scoliosis, unspecified: Secondary | ICD-10-CM | POA: Diagnosis not present

## 2022-06-12 MED ORDER — BUPROPION HCL ER (XL) 150 MG PO TB24: 150.0000 mg | ORAL_TABLET | Freq: Every day | ORAL | 1 refills | Status: DC

## 2022-06-12 MED ORDER — DULOXETINE HCL 20 MG PO CPEP: 20.0000 mg | ORAL_CAPSULE | Freq: Every day | ORAL | 1 refills | Status: DC

## 2022-06-12 MED ORDER — MODAFINIL 100 MG PO TABS: 100.0000 mg | ORAL_TABLET | Freq: Every day | ORAL | 1 refills | Status: DC

## 2022-06-12 NOTE — Progress Notes (Signed)
Established Patient Office Visit  Subjective   Patient ID: Carol Rodgers, female    DOB: 07-21-1975  Age: 46 y.o. MRN: [id number]  Chief Complaint  Patient presents with   Follow-up    HPI Pt is a 46 yo female with MDD, anxiety, chronic fatigue who presents to the clinic for medication refills and follow up.   She is doing well on modafinil. No concerns. She does feels like this helps and would like to continue. She stopped wellbutrin with cymbalta but would like to restart this. She feels like her mood has not been as good off wellbutrin.   She has had 2 months of right anterior rib pain that radiates into the back for about 2 months. She noticed it after a chiropractic adjustment one day. Pain is constant but not severe. Ibuprofen and tylenol help some. She went to GI and had gallbladder check and everything was good. Pain is worse with movement and deep breathing. No cough or reflux.    Active Ambulatory Problems    Diagnosis Date Noted   Depression 09/15/2013   Sleeping difficulties 09/15/2013   Anxiety state 09/15/2013   Chronic fatigue 10/13/2013   Primary insomnia 04/05/2014   Hot flashes 09/13/2015   Dry skin 09/13/2015   No energy 05/31/2017   Hair loss 16/94/5038   Umbilical pain 88/28/0034   Severe episode of recurrent major depressive disorder, without psychotic features ([place]) 12/01/2021   Resolved Ambulatory Problems    Diagnosis Date Noted   Acute non-recurrent maxillary sinusitis 09/23/2017   Past Medical History:  Diagnosis Date   History of stomach ulcers    Mental disorder    No pertinent past medical history      ROS See HPI.    Objective:     BP 111/83   Pulse 73   Ht '5\' 7"'$  (1.702 m)   Wt 118 lb (53.5 kg)   SpO2 100%   BMI 18.48 kg/m  BP Readings from Last 3 Encounters:  06/12/22 111/83  06/04/22 116/60  12/01/21 114/81   Wt Readings from Last 3 Encounters:  06/12/22 118 lb (53.5 kg)  06/04/22 118 lb 12.8 oz (53.9 kg)  12/01/21  112 lb (50.8 kg)    ..    06/12/2022   10:06 AM 12/01/2021    9:50 AM 06/02/2021    2:15 PM 01/10/2021    8:42 AM 04/17/2020    1:18 PM  Depression screen PHQ 2/9  Decreased Interest '2 2 1 1 2  '$ Down, Depressed, Hopeless '2  1 1 1  '$ PHQ - 2 Score '4 2 2 2 3  '$ Altered sleeping '1 3 1 '$ 0 2  Tired, decreased energy '3 2 1 2 3  '$ Change in appetite 2 1 0 0 0  Feeling bad or failure about yourself  '2 1 1 1 1  '$ Trouble concentrating 1 2 0 0 0  Moving slowly or fidgety/restless 0 1 0 0 0  Suicidal thoughts 0 0 0 0 0  PHQ-9 Score '13 12 5 5 9  '$ Difficult doing work/chores Somewhat difficult Not difficult at all Somewhat difficult Somewhat difficult Somewhat difficult   ..    06/12/2022   10:07 AM 12/01/2021    9:51 AM 06/02/2021    2:15 PM 01/10/2021    8:44 AM  GAD 7 : Generalized Anxiety Score  Nervous, Anxious, on Edge '2 3 2 1  '$ Control/stop worrying '1 2 2 1  '$ Worry too much - different things 1 2 2  1  Trouble relaxing '1 2 2 1  '$ Restless 0  1 0  Easily annoyed or irritable '3 1 2 1  '$ Afraid - awful might happen '1 1 1 1  '$ Total GAD 7 Score '9  12 6  '$ Anxiety Difficulty Somewhat difficult Somewhat difficult Somewhat difficult Somewhat difficult      Physical Exam Vitals reviewed.  Constitutional:      Appearance: Normal appearance.  HENT:     Head: Normocephalic.  Cardiovascular:     Rate and Rhythm: Normal rate and regular rhythm.     Pulses: Normal pulses.  Pulmonary:     Effort: Pulmonary effort is normal.     Breath sounds: Normal breath sounds.  Abdominal:     General: There is no distension.     Palpations: Abdomen is soft. There is no mass.     Tenderness: There is no abdominal tenderness. There is no right CVA tenderness, left CVA tenderness, guarding or rebound.  Musculoskeletal:     Comments: Tenderness to palpation over anterior right lower ribs Pain with deep breath  Neurological:     General: No focal deficit present.     Mental Status: She is alert and oriented to person,  place, and time.  Psychiatric:        Mood and Affect: Mood normal.        Assessment & Plan:  Marland KitchenMarland KitchenKamera was seen today for follow-up.  Diagnoses and all orders for this visit:  Chronic fatigue -     modafinil (PROVIGIL) 100 MG tablet; Take 1 tablet (100 mg total) by mouth daily.  Anxiety state -     Discontinue: DULoxetine (CYMBALTA) 20 MG capsule; Take 1 capsule (20 mg total) by mouth daily. -     Discontinue: buPROPion (WELLBUTRIN XL) 150 MG 24 hr tablet; Take 1 tablet (150 mg total) by mouth daily. -     buPROPion (WELLBUTRIN XL) 150 MG 24 hr tablet; Take 1 tablet (150 mg total) by mouth daily. -     DULoxetine (CYMBALTA) 20 MG capsule; Take 1 capsule (20 mg total) by mouth daily.  Severe episode of recurrent major depressive disorder, without psychotic features ([place]) -     Discontinue: DULoxetine (CYMBALTA) 20 MG capsule; Take 1 capsule (20 mg total) by mouth daily. -     Discontinue: buPROPion (WELLBUTRIN XL) 150 MG 24 hr tablet; Take 1 tablet (150 mg total) by mouth daily. -     buPROPion (WELLBUTRIN XL) 150 MG 24 hr tablet; Take 1 tablet (150 mg total) by mouth daily. -     DULoxetine (CYMBALTA) 20 MG capsule; Take 1 capsule (20 mg total) by mouth daily.  Rib pain on right side -     DG Ribs Unilateral W/Chest Right; Future   Refilled provigil for fatigue.   PHQ/GAD not to goal Added wellbutrin to cymbalta  Will get xray of right ribs  Return in about 6 months (around 12/12/2022).    Iran Planas, PA-C

## 2022-06-15 NOTE — Progress Notes (Signed)
No evidence of fracture or any abnormality. Likely the pain represents a intercostal rib strain or could be radiating from a thoracic disc that is hitting a nerve. I would see Dr. Darene Lamer sports medicine since the pain has gone on for 2 months now. He may take a closer look at your thoracic spine.

## 2022-06-17 ENCOUNTER — Ambulatory Visit: Payer: BC Managed Care – PPO | Admitting: Nurse Practitioner

## 2022-06-19 ENCOUNTER — Telehealth: Payer: Self-pay

## 2022-06-19 NOTE — Telephone Encounter (Signed)
Contacted patient and patient is aware and understanding of lab results.

## 2022-10-25 DIAGNOSIS — H65191 Other acute nonsuppurative otitis media, right ear: Secondary | ICD-10-CM | POA: Diagnosis not present

## 2022-10-25 DIAGNOSIS — J029 Acute pharyngitis, unspecified: Secondary | ICD-10-CM | POA: Diagnosis not present

## 2022-12-11 ENCOUNTER — Ambulatory Visit: Payer: BC Managed Care – PPO | Admitting: Physician Assistant

## 2022-12-11 ENCOUNTER — Encounter: Payer: Self-pay | Admitting: Physician Assistant

## 2022-12-11 VITALS — BP 110/76 | HR 104 | Ht 67.0 in | Wt 113.0 lb

## 2022-12-11 DIAGNOSIS — R768 Other specified abnormal immunological findings in serum: Secondary | ICD-10-CM

## 2022-12-11 DIAGNOSIS — G4486 Cervicogenic headache: Secondary | ICD-10-CM

## 2022-12-11 DIAGNOSIS — F5101 Primary insomnia: Secondary | ICD-10-CM | POA: Diagnosis not present

## 2022-12-11 DIAGNOSIS — K580 Irritable bowel syndrome with diarrhea: Secondary | ICD-10-CM

## 2022-12-11 DIAGNOSIS — Z79899 Other long term (current) drug therapy: Secondary | ICD-10-CM | POA: Diagnosis not present

## 2022-12-11 DIAGNOSIS — E538 Deficiency of other specified B group vitamins: Secondary | ICD-10-CM | POA: Diagnosis not present

## 2022-12-11 DIAGNOSIS — F332 Major depressive disorder, recurrent severe without psychotic features: Secondary | ICD-10-CM

## 2022-12-11 DIAGNOSIS — F411 Generalized anxiety disorder: Secondary | ICD-10-CM

## 2022-12-11 DIAGNOSIS — L659 Nonscarring hair loss, unspecified: Secondary | ICD-10-CM

## 2022-12-11 DIAGNOSIS — R5382 Chronic fatigue, unspecified: Secondary | ICD-10-CM

## 2022-12-11 DIAGNOSIS — E559 Vitamin D deficiency, unspecified: Secondary | ICD-10-CM | POA: Diagnosis not present

## 2022-12-11 MED ORDER — DULOXETINE HCL 30 MG PO CPEP
30.0000 mg | ORAL_CAPSULE | Freq: Every day | ORAL | 1 refills | Status: DC
Start: 2022-12-11 — End: 2023-01-19

## 2022-12-11 MED ORDER — TRAZODONE HCL 50 MG PO TABS
25.0000 mg | ORAL_TABLET | Freq: Every evening | ORAL | 3 refills | Status: DC | PRN
Start: 2022-12-11 — End: 2023-10-04

## 2022-12-11 MED ORDER — MODAFINIL 100 MG PO TABS
200.00 mg | ORAL_TABLET | Freq: Every day | ORAL | 1 refills | Status: DC
Start: 2022-12-11 — End: 2023-01-05

## 2022-12-11 NOTE — Progress Notes (Unsigned)
Established Patient Office Visit  Subjective   Patient ID: Carol Rodgers, female    DOB: Nov 20, 1975  Age: 47 y.o. MRN: [id number]  Chief Complaint  Patient presents with   Follow-up   Anxiety   Fatigue    HPI Pt is a 47 yo female who presents to the clinic for medication refills and follow up.   She needs refills on provigil for CFS. She does feel like this is helping. She notices when she does not have it she really struggles.   She is having hair loss for months. No patches. Diffuse hair loss.   Having cervical neck pain and tension headaches.   She is having loose stools and abdominal cramping with eating. No melena or hematochezia. Not tried anything to make better.  .. Active Ambulatory Problems    Diagnosis Date Noted   Depression 09/15/2013   Sleeping difficulties 09/15/2013   Anxiety state 09/15/2013   Chronic fatigue 10/13/2013   Primary insomnia 04/05/2014   Hot flashes 09/13/2015   Dry skin 09/13/2015   No energy 05/31/2017   Hair loss 03/22/2018   Umbilical pain 01/10/2021   Severe episode of recurrent major depressive disorder, without psychotic features ([facility]) 12/01/2021   Resolved Ambulatory Problems    Diagnosis Date Noted   Acute non-recurrent maxillary sinusitis 09/23/2017   Past Medical History:  Diagnosis Date   History of stomach ulcers    Mental disorder    No pertinent past medical history     ROS   See HPI.  Objective:     BP 110/76 (BP Location: Left Arm, Patient Position: Sitting, Cuff Size: Normal)   Pulse (!) 104   Ht 5\' 7"  (1.702 m)   Wt 113 lb (51.3 kg)   SpO2 95%   BMI 17.70 kg/m  BP Readings from Last 3 Encounters:  12/11/22 110/76  06/12/22 111/83  06/04/22 116/60   Wt Readings from Last 3 Encounters:  12/11/22 113 lb (51.3 kg)  06/12/22 118 lb (53.5 kg)  06/04/22 118 lb 12.8 oz (53.9 kg)   ..    12/11/2022   10:21 AM 06/12/2022   10:06 AM 12/01/2021    9:50 AM 06/02/2021    2:15 PM 01/10/2021    8:42 AM   Depression screen PHQ 2/9  Decreased Interest 1 2 2 1 1   Down, Depressed, Hopeless 1 2  1 1   PHQ - 2 Score 2 4 2 2 2   Altered sleeping 2 1 3 1  0  Tired, decreased energy 2 3 2 1 2   Change in appetite 1 2 1  0 0  Feeling bad or failure about yourself  1 2 1 1 1   Trouble concentrating 0 1 2 0 0  Moving slowly or fidgety/restless 0 0 1 0 0  Suicidal thoughts 0 0 0 0 0  PHQ-9 Score 8 13 12 5 5   Difficult doing work/chores Somewhat difficult Somewhat difficult Not difficult at all Somewhat difficult Somewhat difficult   .Marland Kitchen    12/11/2022   10:21 AM 06/12/2022   10:07 AM 12/01/2021    9:51 AM 06/02/2021    2:15 PM  GAD 7 : Generalized Anxiety Score  Nervous, Anxious, on Edge 1 2 3 2   Control/stop worrying 1 1 2 2   Worry too much - different things 1 1 2 2   Trouble relaxing 1 1 2 2   Restless 0 0  1  Easily annoyed or irritable 2 3 1 2   Afraid - awful might happen 1  1 1 1   Total GAD 7 Score 7 9  12   Anxiety Difficulty Somewhat difficult Somewhat difficult Somewhat difficult Somewhat difficult        Physical Exam HENT:     Head: Normocephalic.  Cardiovascular:     Rate and Rhythm: Regular rhythm. Tachycardia present.     Pulses: Normal pulses.     Heart sounds: Normal heart sounds.  Pulmonary:     Effort: Pulmonary effort is normal.     Breath sounds: Normal breath sounds.  Abdominal:     General: There is no distension.     Palpations: Abdomen is soft. There is no mass.     Tenderness: There is no abdominal tenderness. There is no right CVA tenderness, left CVA tenderness, guarding or rebound.     Hernia: No hernia is present.  Musculoskeletal:     Cervical back: Normal range of motion and neck supple. No tenderness.     Right lower leg: No edema.     Left lower leg: No edema.  Lymphadenopathy:     Cervical: No cervical adenopathy.  Neurological:     General: No focal deficit present.     Mental Status: She is alert and oriented to person, place, and time.   Psychiatric:        Mood and Affect: Mood normal.        The 10-year ASCVD risk score (Arnett DK, et al., 2019) is: 0.6%    Assessment & Plan:  Marland KitchenMarland KitchenWarren was seen today for follow-up, anxiety and fatigue.  Diagnoses and all orders for this visit:  Chronic fatigue -     modafinil (PROVIGIL) 100 MG tablet; Take 2 tablets (200 mg total) by mouth daily. -     B12 and Folate Panel -     VITAMIN D 25 Hydroxy (Vit-D Deficiency, Fractures) -     TSH -     Thyroid peroxidase antibody -     COMPLETE METABOLIC PANEL WITH GFR -     CBC w/Diff/Platelet -     Fe+TIBC+Fer -     Estradiol -     FSH/LH -     CP Testosterone, BIO-Female/Children -     Progesterone -     ANA Screen,IFA,Reflex Titer/Pattern,Reflex Mplx 11 Ab Cascade with IdentRA  Primary insomnia -     traZODone (DESYREL) 50 MG tablet; Take 0.5-1 tablets (25-50 mg total) by mouth at bedtime as needed. for sleep -     B12 and Folate Panel -     VITAMIN D 25 Hydroxy (Vit-D Deficiency, Fractures) -     TSH -     Thyroid peroxidase antibody -     COMPLETE METABOLIC PANEL WITH GFR -     CBC w/Diff/Platelet -     Fe+TIBC+Fer -     Estradiol -     FSH/LH -     CP Testosterone, BIO-Female/Children -     Progesterone -     ANA Screen,IFA,Reflex Titer/Pattern,Reflex Mplx 11 Ab Cascade with IdentRA  Anxiety state -     B12 and Folate Panel -     VITAMIN D 25 Hydroxy (Vit-D Deficiency, Fractures) -     TSH -     Thyroid peroxidase antibody -     COMPLETE METABOLIC PANEL WITH GFR -     CBC w/Diff/Platelet -     Fe+TIBC+Fer -     Estradiol -     FSH/LH -     CP Testosterone, BIO-Female/Children -  Progesterone -     ANA Screen,IFA,Reflex Titer/Pattern,Reflex Mplx 11 Ab Cascade with IdentRA  Severe episode of recurrent major depressive disorder, without psychotic features ([facility]) -     B12 and Folate Panel -     VITAMIN D 25 Hydroxy (Vit-D Deficiency, Fractures) -     TSH -     Thyroid peroxidase antibody -      COMPLETE METABOLIC PANEL WITH GFR -     CBC w/Diff/Platelet -     Fe+TIBC+Fer -     DULoxetine (CYMBALTA) 30 MG capsule; Take 1 capsule (30 mg total) by mouth daily. -     Estradiol -     FSH/LH -     CP Testosterone, BIO-Female/Children -     Progesterone -     ANA Screen,IFA,Reflex Titer/Pattern,Reflex Mplx 11 Ab Cascade with IdentRA  Hair loss -     B12 and Folate Panel -     VITAMIN D 25 Hydroxy (Vit-D Deficiency, Fractures) -     TSH -     Thyroid peroxidase antibody -     COMPLETE METABOLIC PANEL WITH GFR -     CBC w/Diff/Platelet -     Fe+TIBC+Fer -     Estradiol -     FSH/LH -     CP Testosterone, BIO-Female/Children -     Progesterone -     ANA Screen,IFA,Reflex Titer/Pattern,Reflex Mplx 11 Ab Cascade with IdentRA  Irritable bowel syndrome with diarrhea -     B12 and Folate Panel -     VITAMIN D 25 Hydroxy (Vit-D Deficiency, Fractures) -     TSH -     Thyroid peroxidase antibody -     COMPLETE METABOLIC PANEL WITH GFR -     CBC w/Diff/Platelet -     Fe+TIBC+Fer -     DULoxetine (CYMBALTA) 30 MG capsule; Take 1 capsule (30 mg total) by mouth daily. -     Estradiol -     FSH/LH -     CP Testosterone, BIO-Female/Children -     Progesterone -     ANA Screen,IFA,Reflex Titer/Pattern,Reflex Mplx 11 Ab Cascade with IdentRA  Cervicogenic headache -     B12 and Folate Panel -     VITAMIN D 25 Hydroxy (Vit-D Deficiency, Fractures) -     TSH -     Thyroid peroxidase antibody -     COMPLETE METABOLIC PANEL WITH GFR -     CBC w/Diff/Platelet -     Fe+TIBC+Fer -     DULoxetine (CYMBALTA) 30 MG capsule; Take 1 capsule (30 mg total) by mouth daily. -     Estradiol -     FSH/LH -     CP Testosterone, BIO-Female/Children -     Progesterone -     ANA Screen,IFA,Reflex Titer/Pattern,Reflex Mplx 11 Ab Cascade with IdentRA   Will get some labs to evaluate symptoms Increased cymbalta to 30mg  daily to see if helps with IBS symptoms and neck pain  Bentyl to use as needed at  meal time, discussed SE of constipation Refilled provigil for fatigue Trazodone as needed for sleep Consider cervical pillow to help with neck pain and headaches Consider regular massages  Spent 40 minutes with patient discussing plan, reviewing chart, and coordinating care.    Return in about 3 months (around 03/13/2023).    Tandy Gaw, PA-C

## 2022-12-12 LAB — FSH/LH
FSH: 3.9 m[IU]/mL
LH: 4.7 m[IU]/mL

## 2022-12-12 LAB — CBC WITH DIFFERENTIAL/PLATELET
Eosinophils Relative: 3.2 %
HCT: 37.3 % (ref 35.0–45.0)
MCV: 87.1 fL (ref 80.0–100.0)
MPV: 11.5 fL (ref 7.5–12.5)
Neutro Abs: 2055 cells/uL (ref 1500–7800)

## 2022-12-12 LAB — COMPLETE METABOLIC PANEL WITH GFR
AG Ratio: 1.6 (calc) (ref 1.0–2.5)
ALT: 7 U/L (ref 6–29)
Alkaline phosphatase (APISO): 38 U/L (ref 31–125)
Glucose, Bld: 91 mg/dL (ref 65–99)

## 2022-12-12 LAB — PROGESTERONE: Progesterone: 13.6 ng/mL

## 2022-12-12 LAB — VITAMIN D 25 HYDROXY (VIT D DEFICIENCY, FRACTURES): Vit D, 25-Hydroxy: 37 ng/mL (ref 30–100)

## 2022-12-13 LAB — CBC WITH DIFFERENTIAL/PLATELET
Lymphs Abs: 1005 cells/uL (ref 850–3900)
MCHC: 33.5 g/dL (ref 32.0–36.0)
Neutrophils Relative %: 58.7 %
WBC: 3.5 10*3/uL — ABNORMAL LOW (ref 3.8–10.8)

## 2022-12-13 LAB — COMPLETE METABOLIC PANEL WITH GFR
BUN: 13 mg/dL (ref 7–25)
CO2: 26 mmol/L (ref 20–32)
Chloride: 106 mmol/L (ref 98–110)
Globulin: 2.7 g/dL (calc) (ref 1.9–3.7)
Total Bilirubin: 0.6 mg/dL (ref 0.2–1.2)
Total Protein: 6.9 g/dL (ref 6.1–8.1)

## 2022-12-13 LAB — IRON,TIBC AND FERRITIN PANEL: %SAT: 13 % (calc) — ABNORMAL LOW (ref 16–45)

## 2022-12-13 LAB — ESTRADIOL: Estradiol: 265 pg/mL

## 2022-12-13 LAB — ANA SCREEN,IFA,REFLEX TITER/PATTERN,REFLEX MPLX 11 AB CASCADE: Anti Nuclear Antibody (ANA): POSITIVE — AB

## 2022-12-14 ENCOUNTER — Other Ambulatory Visit: Payer: Self-pay | Admitting: *Deleted

## 2022-12-14 ENCOUNTER — Encounter: Payer: Self-pay | Admitting: Physician Assistant

## 2022-12-14 ENCOUNTER — Other Ambulatory Visit: Payer: Self-pay | Admitting: Physician Assistant

## 2022-12-14 DIAGNOSIS — E538 Deficiency of other specified B group vitamins: Secondary | ICD-10-CM | POA: Insufficient documentation

## 2022-12-14 DIAGNOSIS — F332 Major depressive disorder, recurrent severe without psychotic features: Secondary | ICD-10-CM

## 2022-12-14 DIAGNOSIS — F411 Generalized anxiety disorder: Secondary | ICD-10-CM

## 2022-12-14 LAB — CP TESTOSTERONE, BIO-FEMALE/CHILDREN: Sex Hormone Binding: 135.7 nmol/L — ABNORMAL HIGH (ref 17–124)

## 2022-12-14 LAB — COMPLETE METABOLIC PANEL WITH GFR
AST: 11 U/L (ref 10–30)
Creat: 0.84 mg/dL (ref 0.50–0.99)
Sodium: 139 mmol/L (ref 135–146)

## 2022-12-14 LAB — TSH: TSH: 2.04 mIU/L

## 2022-12-14 LAB — INTERPRETATION

## 2022-12-14 LAB — TIER 3: Ribosomal P Protein Ab: <1 AI

## 2022-12-14 LAB — B12 AND FOLATE PANEL: Folate: 14.5 ng/mL

## 2022-12-14 LAB — TIER 2
SSA (Ro) (ENA) Antibody, IgG: <1 AI
SSB (La) (ENA) Antibody, IgG: <1 AI
Scleroderma (Scl-70) (ENA) Antibody, IgG: <1 AI

## 2022-12-14 LAB — CBC WITH DIFFERENTIAL/PLATELET
MCH: 29.2 pg (ref 27.0–33.0)
Monocytes Relative: 8.3 %

## 2022-12-14 LAB — THYROID PEROXIDASE ANTIBODY: Thyroperoxidase Ab SerPl-aCnc: 3 IU/mL (ref <9)

## 2022-12-14 LAB — IRON,TIBC AND FERRITIN PANEL: TIBC: 429 mcg/dL (calc) (ref 250–450)

## 2022-12-14 LAB — ANA SCREEN,IFA,REFLEX TITER/PATTERN,REFLEX MPLX 11 AB CASCADE: Rheumatoid fact SerPl-aCnc: <10 IU/mL (ref <14)

## 2022-12-14 NOTE — Progress Notes (Signed)
Carol Rodgers,   Kidney, liver, glucose look great.  Thyroid looks wonderful.  Vitamin D better. Continue taking vitamin D daily. Goal above 50. Increase by 1000 units. Take with dairy for better absorption.  Hormones look really good.  Iron stores really low. Are you taking any extra iron?  B12 really low. Need to restart b12 orally. daily recheck in 2 months. If you want to get a shot to boost fast we could do one shot as well and then start oral.  ANA very low positive. Likely false positive with that low of results. I would not suggest any further work up.

## 2022-12-15 MED ORDER — DICYCLOMINE HCL 10 MG PO CAPS: 10.0000 mg | ORAL_CAPSULE | Freq: Three times a day (TID) | ORAL | 0 refills | Status: DC

## 2022-12-16 LAB — ANA SCREEN,IFA,REFLEX TITER/PATTERN,REFLEX MPLX 11 AB CASCADE
Cyclic Citrullin Peptide Ab: <16 UNITS
MUTATED CITRULLINATED VIMENTIN (MCV) AB: <20 U/mL (ref <20)

## 2022-12-16 LAB — CBC WITH DIFFERENTIAL/PLATELET
Absolute Monocytes: 291 cells/uL (ref 200–950)
Basophils Absolute: 39 cells/uL (ref 0–200)
Basophils Relative: 1.1 %
Eosinophils Absolute: 112 cells/uL (ref 15–500)
Hemoglobin: 12.5 g/dL (ref 11.7–15.5)
Platelets: 218 10*3/uL (ref 140–400)
RBC: 4.28 10*6/uL (ref 3.80–5.10)
RDW: 12.6 % (ref 11.0–15.0)
Total Lymphocyte: 28.7 %

## 2022-12-16 LAB — COMPLETE METABOLIC PANEL WITH GFR
Albumin: 4.2 g/dL (ref 3.6–5.1)
Calcium: 9.3 mg/dL (ref 8.6–10.2)
Potassium: 4 mmol/L (ref 3.5–5.3)
eGFR: 88 mL/min/{1.73_m2} (ref >=60)

## 2022-12-16 LAB — CP TESTOSTERONE, BIO-FEMALE/CHILDREN
Albumin: 4.4 g/dL (ref 3.6–5.1)
TESTOSTERONE, BIOAVAILABLE: 2.6 ng/dL (ref 0.5–8.5)
Testosterone, Free: 1.3 pg/mL (ref 0.2–5.0)
Testosterone, Total, LC-MS-MS: 38 ng/dL (ref 2–45)

## 2022-12-16 LAB — TIER 1
Chromatin (Nucleosomal) Antibody: <1 AI
ENA SM Ab Ser-aCnc: <1 AI
Ribonucleic Protein(ENA) Antibody, IgG: <1 AI
SM/RNP: <1 AI
ds DNA Ab: <1 IU/mL

## 2022-12-16 LAB — B12 AND FOLATE PANEL: Vitamin B-12: 191 pg/mL — ABNORMAL LOW (ref 200–1100)

## 2022-12-16 LAB — ANTI-NUCLEAR AB-TITER (ANA TITER): ANA Titer 1: 1:40 {titer} — ABNORMAL HIGH

## 2022-12-16 LAB — TIER 3: Centromere Ab Screen: <1 AI

## 2022-12-16 LAB — TIER 2: Jo-1 Autoabs: <1 AI

## 2022-12-16 LAB — IRON,TIBC AND FERRITIN PANEL
Ferritin: 4 ng/mL — ABNORMAL LOW (ref 16–232)
Iron: 55 ug/dL (ref 40–190)

## 2023-01-02 ENCOUNTER — Other Ambulatory Visit: Payer: Self-pay | Admitting: Physician Assistant

## 2023-01-02 DIAGNOSIS — R5382 Chronic fatigue, unspecified: Secondary | ICD-10-CM

## 2023-01-19 ENCOUNTER — Encounter: Payer: Self-pay | Admitting: Physician Assistant

## 2023-01-19 DIAGNOSIS — F332 Major depressive disorder, recurrent severe without psychotic features: Secondary | ICD-10-CM

## 2023-01-19 DIAGNOSIS — G4486 Cervicogenic headache: Secondary | ICD-10-CM

## 2023-01-19 DIAGNOSIS — K580 Irritable bowel syndrome with diarrhea: Secondary | ICD-10-CM

## 2023-01-19 MED ORDER — DULOXETINE HCL 30 MG PO CPEP
30.0000 mg | ORAL_CAPSULE | Freq: Every day | ORAL | 1 refills | Status: DC
Start: 2023-01-19 — End: 2023-06-08

## 2023-03-15 ENCOUNTER — Ambulatory Visit: Payer: BC Managed Care – PPO | Admitting: Physician Assistant

## 2023-03-15 ENCOUNTER — Encounter: Payer: Self-pay | Admitting: Physician Assistant

## 2023-03-15 ENCOUNTER — Other Ambulatory Visit: Payer: Self-pay | Admitting: Physician Assistant

## 2023-03-15 VITALS — BP 115/74 | HR 92 | Ht 67.0 in | Wt 113.2 lb

## 2023-03-15 DIAGNOSIS — F332 Major depressive disorder, recurrent severe without psychotic features: Secondary | ICD-10-CM

## 2023-03-15 DIAGNOSIS — D508 Other iron deficiency anemias: Secondary | ICD-10-CM | POA: Diagnosis not present

## 2023-03-15 DIAGNOSIS — F5101 Primary insomnia: Secondary | ICD-10-CM | POA: Diagnosis not present

## 2023-03-15 DIAGNOSIS — F411 Generalized anxiety disorder: Secondary | ICD-10-CM

## 2023-03-15 DIAGNOSIS — Z23 Encounter for immunization: Secondary | ICD-10-CM | POA: Diagnosis not present

## 2023-03-15 DIAGNOSIS — E538 Deficiency of other specified B group vitamins: Secondary | ICD-10-CM | POA: Diagnosis not present

## 2023-03-15 NOTE — Progress Notes (Signed)
Established Patient Office Visit  Subjective   Patient ID: Carol Rodgers, female    DOB: 05-12-1976  Age: 47 y.o. MRN: [id number]  Chief Complaint  Patient presents with   Depression    Follow up - requesting blood work.     HPI Pt is a 47 yo female with insomnia, chronic fatigue, depressed mood, anxiety, b12 deficiency who presents to the clinic for follow up.   Cymbalta was increased to 30mg . Not noticed any real difference.   Insomnia is better with trazodone but does not take it daily.   Felt more energy after b12 shot but then started struggling with energy again. On OTC b12 and vitamin D and iron.    Active Ambulatory Problems    Diagnosis Date Noted   Depression 09/15/2013   Sleeping difficulties 09/15/2013   Anxiety state 09/15/2013   Chronic fatigue 10/13/2013   Primary insomnia 04/05/2014   Hot flashes 09/13/2015   Dry skin 09/13/2015   No energy 05/31/2017   Hair loss 03/22/2018   Umbilical pain 01/10/2021   Severe episode of recurrent major depressive disorder, without psychotic features (HCC) 12/01/2021   B12 deficiency 12/14/2022   Resolved Ambulatory Problems    Diagnosis Date Noted   Acute non-recurrent maxillary sinusitis 09/23/2017   Past Medical History:  Diagnosis Date   History of stomach ulcers    Mental disorder    No pertinent past medical history      ROS See HPI.    Objective:     BP 115/74   Pulse 92   Ht 5\' 7"  (1.702 m)   Wt 113 lb 4 oz (51.4 kg)   SpO2 100%   BMI 17.74 kg/m  BP Readings from Last 3 Encounters:  03/15/23 115/74  12/11/22 110/76  06/12/22 111/83   Wt Readings from Last 3 Encounters:  03/15/23 113 lb 4 oz (51.4 kg)  12/11/22 113 lb (51.3 kg)  06/12/22 118 lb (53.5 kg)    ..    03/15/2023    9:56 AM 12/11/2022   10:21 AM 06/12/2022   10:06 AM 12/01/2021    9:50 AM 06/02/2021    2:15 PM  Depression screen PHQ 2/9  Decreased Interest 1 1 2 2 1   Down, Depressed, Hopeless 1 1 2  1   PHQ - 2 Score 2 2  4 2 2   Altered sleeping 1 2 1 3 1   Tired, decreased energy 3 2 3 2 1   Change in appetite 2 1 2 1  0  Feeling bad or failure about yourself  1 1 2 1 1   Trouble concentrating 0 0 1 2 0  Moving slowly or fidgety/restless 0 0 0 1 0  Suicidal thoughts 0 0 0 0 0  PHQ-9 Score 9 8 13 12 5   Difficult doing work/chores Somewhat difficult Somewhat difficult Somewhat difficult Not difficult at all Somewhat difficult   ..    03/15/2023    9:56 AM 12/11/2022   10:21 AM 06/12/2022   10:07 AM 12/01/2021    9:51 AM  GAD 7 : Generalized Anxiety Score  Nervous, Anxious, on Edge 1 1 2 3   Control/stop worrying 1 1 1 2   Worry too much - different things 1 1 1 2   Trouble relaxing 1 1 1 2   Restless 0 0 0   Easily annoyed or irritable 2 2 3 1   Afraid - awful might happen 1 1 1 1   Total GAD 7 Score 7 7 9    Anxiety Difficulty  Somewhat difficult Somewhat difficult Somewhat difficult Somewhat difficult      Physical Exam Constitutional:      Appearance: Normal appearance.  Cardiovascular:     Rate and Rhythm: Normal rate.  Pulmonary:     Effort: Pulmonary effort is normal.  Neurological:     General: No focal deficit present.     Mental Status: She is alert and oriented to person, place, and time.  Psychiatric:        Mood and Affect: Mood normal.     The 10-year ASCVD risk score (Arnett DK, et al., 2019) is: 0.6%    Assessment & Plan:  Marland KitchenMarland KitchenQuinnlee was seen today for depression.  Diagnoses and all orders for this visit:  Primary insomnia  Anxiety state  Severe episode of recurrent major depressive disorder, without psychotic features (HCC)  B12 deficiency -     B12 and Folate Panel  Iron deficiency anemia secondary to inadequate dietary iron intake -     CBC w/Diff/Platelet -     Fe+TIBC+Fer -     B12 and Folate Panel  Encounter for immunization -     Flu vaccine trivalent PF, 6mos and older(Flulaval,Afluria,Fluarix,Fluzone)   Flu shot given today PHQ/GAD about the same no  improvement Continue on same medications Discussed taking trazodone regularly and consider increasing provigil Will recheck B12 and iron panel and adjust medication and make recommendations accordingly.   Tandy Gaw, PA-C

## 2023-03-16 LAB — CBC WITH DIFFERENTIAL/PLATELET
Basophils Absolute: 0 10*3/uL (ref 0.0–0.2)
Basos: 1 %
EOS (ABSOLUTE): 0.1 10*3/uL (ref 0.0–0.4)
Eos: 3 %
Hematocrit: 39 % (ref 34.0–46.6)
Hemoglobin: 12.4 g/dL (ref 11.1–15.9)
Immature Grans (Abs): 0 10*3/uL (ref 0.0–0.1)
Immature Granulocytes: 0 %
Lymphocytes Absolute: 1 10*3/uL (ref 0.7–3.1)
Lymphs: 29 %
MCH: 28.4 pg (ref 26.6–33.0)
MCHC: 31.8 g/dL (ref 31.5–35.7)
MCV: 89 fL (ref 79–97)
Monocytes Absolute: 0.2 10*3/uL (ref 0.1–0.9)
Monocytes: 7 %
Neutrophils Absolute: 2.1 10*3/uL (ref 1.4–7.0)
Neutrophils: 60 %
Platelets: 275 10*3/uL (ref 150–450)
RBC: 4.36 x10E6/uL (ref 3.77–5.28)
RDW: 12.2 % (ref 11.7–15.4)
WBC: 3.5 10*3/uL (ref 3.4–10.8)

## 2023-03-16 LAB — B12 AND FOLATE PANEL
Folate: 11.1 ng/mL (ref >3.0)
Vitamin B-12: 242 pg/mL (ref 232–1245)

## 2023-03-16 LAB — IRON,TIBC AND FERRITIN PANEL
Ferritin: 9 ng/mL — ABNORMAL LOW (ref 15–150)
Iron Saturation: 33 % (ref 15–55)
Iron: 135 ug/dL (ref 27–159)
Total Iron Binding Capacity: 408 ug/dL (ref 250–450)
UIBC: 273 ug/dL (ref 131–425)

## 2023-03-22 NOTE — Progress Notes (Signed)
B12 is still pretty low despite oral b12. I think it is reasonable to start with monthly B12 shots and recheck in 3 months B12 level. Ok to be nurse visits.   Serum iron and hemoglobin look good but iron stores are still low. Continue with oral iron and take with vitamin C for better absorption.

## 2023-03-24 ENCOUNTER — Ambulatory Visit (INDEPENDENT_AMBULATORY_CARE_PROVIDER_SITE_OTHER): Payer: BC Managed Care – PPO

## 2023-03-24 VITALS — BP 113/89 | HR 94 | Ht 67.0 in | Wt 112.1 lb

## 2023-03-24 DIAGNOSIS — E538 Deficiency of other specified B group vitamins: Secondary | ICD-10-CM

## 2023-03-24 MED ORDER — CYANOCOBALAMIN 1000 MCG/ML IJ SOLN
1000.0000 ug | Freq: Once | INTRAMUSCULAR | Status: AC
Start: 2023-03-24 — End: 2023-03-24
  Administered 2023-03-24: 1000 ug via INTRAMUSCULAR

## 2023-03-24 NOTE — Progress Notes (Signed)
Established Patient Office Visit  Subjective   Patient ID: Carol Rodgers, female    DOB: [birth year]  Age: 47 y.o. MRN: [id number]  Chief Complaint  Patient presents with   Injections    B-12 injection nurse visit    HPI  Patient is here for B-12 injection. Denies muscle cramps, weakness or irregular heart rate.  ROS    Objective:     BP 113/89   Pulse 94   Ht 5\' 7"  (1.702 m)   Wt 112 lb 1.9 oz (50.9 kg)   SpO2 97%   BMI 17.56 kg/m    Physical Exam   No results found for any visits on 03/24/23.    The 10-year ASCVD risk score (Arnett DK, et al., 2019) is: 0.6%    Assessment & Plan:  Patient tolerated injection well without complications. Patient advised to schedule next B-12 injection in 30 days. Problem List Items Addressed This Visit       Other   B12 deficiency - Primary   Relevant Medications   cyanocobalamin (VITAMIN B12) injection 1,000 mcg    No follow-ups on file.    Colbey Wirtanen  Lindsay-Shavers, CMA

## 2023-03-24 NOTE — Patient Instructions (Signed)
Patient advised to schedule next injection for B-12 in 30 days.

## 2023-04-03 ENCOUNTER — Other Ambulatory Visit: Payer: Self-pay | Admitting: Physician Assistant

## 2023-04-03 DIAGNOSIS — R5382 Chronic fatigue, unspecified: Secondary | ICD-10-CM

## 2023-04-06 MED ORDER — MODAFINIL 100 MG PO TABS: 100.0000 mg | ORAL_TABLET | Freq: Every day | ORAL | 1 refills | Status: DC

## 2023-04-23 ENCOUNTER — Ambulatory Visit: Payer: BC Managed Care – PPO

## 2023-04-26 ENCOUNTER — Ambulatory Visit (INDEPENDENT_AMBULATORY_CARE_PROVIDER_SITE_OTHER): Payer: BC Managed Care – PPO

## 2023-04-26 DIAGNOSIS — E538 Deficiency of other specified B group vitamins: Secondary | ICD-10-CM

## 2023-04-26 MED ORDER — CYANOCOBALAMIN 1000 MCG/ML IJ SOLN
1000.0000 ug | Freq: Once | INTRAMUSCULAR | Status: AC
Start: 2023-04-26 — End: 2023-04-26
  Administered 2023-04-26: 1000 ug via INTRAMUSCULAR

## 2023-04-26 NOTE — Progress Notes (Signed)
   Established Patient Office Visit  Subjective   Patient ID: Carol Rodgers, female    DOB: [birth year]  Age: 47 y.o. MRN: [id number]  Chief Complaint  Patient presents with   Pernicious Anemia    HPI  Carol Rodgers is here for a vitamin B 12 injection. Denies muscle cramps, weakness or irregular heart rate.   ROS    Objective:     There were no vitals taken for this visit.   Physical Exam   No results found for any visits on 04/26/23.    The 10-year ASCVD risk score (Arnett DK, et al., 2019) is: 0.6%    Assessment & Plan:  B12 injection - Patient tolerated injection well without complications. Patient advised to schedule next injection 30 days from today.    Problem List Items Addressed This Visit       Unprioritized   B12 deficiency - Primary    Return in about 30 days (around 05/26/2023) for B12 injectrion. Earna Coder, Janalyn Harder, CMA

## 2023-05-24 ENCOUNTER — Ambulatory Visit (INDEPENDENT_AMBULATORY_CARE_PROVIDER_SITE_OTHER): Payer: BC Managed Care – PPO | Admitting: Physician Assistant

## 2023-05-24 VITALS — BP 111/75 | HR 90 | Resp 20

## 2023-05-24 DIAGNOSIS — E538 Deficiency of other specified B group vitamins: Secondary | ICD-10-CM

## 2023-05-24 MED ORDER — CYANOCOBALAMIN 1000 MCG/ML IJ SOLN
1000.0000 ug | Freq: Once | INTRAMUSCULAR | Status: AC
Start: 2023-05-24 — End: 2023-05-24
  Administered 2023-05-24: 1000 ug via INTRAMUSCULAR

## 2023-05-24 NOTE — Progress Notes (Signed)
Agree with below plan.

## 2023-05-24 NOTE — Progress Notes (Signed)
Patient here for B12 injection. Denies muscle cramps, weakness or irregular heart rate.  Patient advised to schedule next appointment for 30 days.  Location: Right Deltoid

## 2023-06-08 ENCOUNTER — Other Ambulatory Visit: Payer: Self-pay | Admitting: Physician Assistant

## 2023-06-08 DIAGNOSIS — G4486 Cervicogenic headache: Secondary | ICD-10-CM

## 2023-06-08 DIAGNOSIS — K580 Irritable bowel syndrome with diarrhea: Secondary | ICD-10-CM

## 2023-06-08 DIAGNOSIS — F332 Major depressive disorder, recurrent severe without psychotic features: Secondary | ICD-10-CM

## 2023-06-21 ENCOUNTER — Ambulatory Visit (INDEPENDENT_AMBULATORY_CARE_PROVIDER_SITE_OTHER): Payer: BC Managed Care – PPO

## 2023-06-21 DIAGNOSIS — E538 Deficiency of other specified B group vitamins: Secondary | ICD-10-CM

## 2023-06-21 MED ORDER — CYANOCOBALAMIN 1000 MCG/ML IJ SOLN
1000.0000 ug | Freq: Once | INTRAMUSCULAR | Status: AC
Start: 2023-06-21 — End: 2023-06-21
  Administered 2023-06-21: 1000 ug via INTRAMUSCULAR

## 2023-06-21 NOTE — Progress Notes (Signed)
   Established Patient Office Visit  Subjective   Patient ID: Carol Rodgers, female    DOB: [birth year]  Age: 47 y.o. MRN: [id number]  Chief Complaint  Patient presents with   Pernicious Anemia    HPI  Carol Rodgers is here for a vitamin B 12 injection. Denies muscle cramps, weakness or irregular heart rate.   ROS    Objective:     There were no vitals taken for this visit.   Physical Exam   No results found for any visits on 06/21/23.    The 10-year ASCVD risk score (Arnett DK, et al., 2019) is: 0.6%    Assessment & Plan:  B12 injection - Patient tolerated injection well without complications. Patient advised to schedule next injection 30 days from today.    Problem List Items Addressed This Visit       Unprioritized   B12 deficiency - Primary    Return in about 1 month (around 07/22/2023) for B12 injection. Earna Coder, Janalyn Harder, CMA

## 2023-07-16 ENCOUNTER — Other Ambulatory Visit: Payer: Self-pay

## 2023-07-16 DIAGNOSIS — E538 Deficiency of other specified B group vitamins: Secondary | ICD-10-CM

## 2023-07-19 ENCOUNTER — Ambulatory Visit: Payer: BC Managed Care – PPO

## 2023-09-29 ENCOUNTER — Other Ambulatory Visit: Payer: Self-pay | Admitting: Physician Assistant

## 2023-09-29 DIAGNOSIS — F5101 Primary insomnia: Secondary | ICD-10-CM

## 2024-06-05 ENCOUNTER — Other Ambulatory Visit: Payer: Self-pay | Admitting: Physician Assistant

## 2024-06-05 DIAGNOSIS — G4486 Cervicogenic headache: Secondary | ICD-10-CM

## 2024-06-05 DIAGNOSIS — F332 Major depressive disorder, recurrent severe without psychotic features: Secondary | ICD-10-CM

## 2024-06-05 DIAGNOSIS — K580 Irritable bowel syndrome with diarrhea: Secondary | ICD-10-CM

## 2024-06-30 ENCOUNTER — Encounter: Payer: Self-pay | Admitting: Gastroenterology

## 2024-06-30 ENCOUNTER — Ambulatory Visit: Payer: Self-pay | Admitting: Gastroenterology

## 2024-06-30 ENCOUNTER — Ambulatory Visit
Admission: RE | Admit: 2024-06-30 | Discharge: 2024-06-30 | Disposition: A | Discharge status: Home / Self Care | Source: Ambulatory Visit | Attending: Gastroenterology | Admitting: Gastroenterology

## 2024-06-30 ENCOUNTER — Ambulatory Visit: Admitting: Gastroenterology

## 2024-06-30 VITALS — BP 118/72 | HR 110 | Ht 67.0 in | Wt 130.0 lb

## 2024-06-30 DIAGNOSIS — Z860100 Personal history of colon polyps, unspecified: Secondary | ICD-10-CM | POA: Diagnosis not present

## 2024-06-30 DIAGNOSIS — K529 Noninfective gastroenteritis and colitis, unspecified: Secondary | ICD-10-CM | POA: Diagnosis not present

## 2024-06-30 DIAGNOSIS — R194 Change in bowel habit: Secondary | ICD-10-CM | POA: Diagnosis not present

## 2024-06-30 DIAGNOSIS — R143 Flatulence: Secondary | ICD-10-CM

## 2024-06-30 DIAGNOSIS — R109 Unspecified abdominal pain: Secondary | ICD-10-CM | POA: Diagnosis not present

## 2024-06-30 DIAGNOSIS — R140 Abdominal distension (gaseous): Secondary | ICD-10-CM

## 2024-06-30 DIAGNOSIS — R152 Fecal urgency: Secondary | ICD-10-CM

## 2024-06-30 DIAGNOSIS — R159 Full incontinence of feces: Secondary | ICD-10-CM

## 2024-06-30 MED ORDER — NA SULFATE-K SULFATE-MG SULF 17.5-3.13-1.6 GM/177ML PO SOLN: 1.0000 | Freq: Once | ORAL | 0 refills | Status: AC

## 2024-06-30 NOTE — Progress Notes (Signed)
 "  Chief Complaint: Chronic diarrhea Primary GI Doctor:Dr. San  HPI:  Patient is a  49  year old female patient with past medical history of insomnia, chronic fatigue, depressed mood, anxiety, b12 deficiency, who was self referred to me for a evaluation of chronic diarrhea.    Interval History Patient last seen in GI office on 06/04/2022 by Elida, NP for diarrhea.  Patient presents for evaluation of chronic diarrhea.  Patient states she is at diarrhea for several years however as of most recent it has increased in severity and frequency.  Patient will have up to 4-5 loose stools per day with stress incontinence as well as nocturnal symptoms.  Patient reports she has a lot of gas and bloating.  Patient states every time she passes gas there is concern stool will leak out.  Patient denies any food triggers.  Patient does admit to abdominal cramping with the symptoms intermittently.  No blood in stool. Patient denies new medications. No recent travel or exposure. Patient states she has taken over-the-counter Tums. Patient has also taken over-the-counter Imodium which she states causes constipation. Patient reports she consumes a lot of fiber in her diet. Reports her symptoms Dewayne Severe be worse with stress.  Wt Readings from Last 3 Encounters:  06/30/24 130 lb (59 kg)  03/24/23 112 lb 1.9 oz (50.9 kg)  03/15/23 113 lb 4 oz (51.4 kg)    Past Medical History:  Diagnosis Date   History of stomach ulcers    age 15   Mental disorder    No pertinent past medical history     Past Surgical History:  Procedure Laterality Date   CESAREAN SECTION     CESAREAN SECTION  06/07/2012   Procedure: CESAREAN SECTION;  Surgeon: Truman Corona, MD;  Location: [facility];  Service: Obstetrics;  Laterality: N/A;  Repeat edc 06/10/12   ESOPHAGOGASTRODUODENOSCOPY     age 58   NASAL SINUS SURGERY     TONSILLECTOMY      Current Outpatient Medications  Medication Sig Dispense Refill   DULoxetine  (CYMBALTA )  30 MG capsule TAKE 1 CAPSULE BY MOUTH DAILY 90 capsule 3   Na Sulfate-K Sulfate-Mg Sulfate concentrate (SUPREP) 17.5-3.13-1.6 GM/177ML SOLN Take 1 kit (354 mLs total) by mouth once for 1 dose. 354 mL 0   traZODone  (DESYREL ) 50 MG tablet TAKE 1/2 TO 1 TABLET BY MOUTH AT BEDTIME AS NEEDED FOR SLEEP 90 tablet 3   No current facility-administered medications for this visit.    Allergies as of 06/30/2024 - Review Complete 06/30/2024  Allergen Reaction Noted   Viibryd  [vilazodone  hcl] Other (See Comments) 10/28/2015    Family History  Problem Relation Age of Onset   Lung cancer Maternal Grandfather    Hypertension Paternal Grandfather    Heart attack Paternal Grandfather    Colon cancer Other        in maternal great aunt and uncle   Esophageal cancer Neg Hx    Rectal cancer Neg Hx    Stomach cancer Neg Hx     Review of Systems:    Constitutional: No weight loss, fever, chills, weakness or fatigue HEENT: Eyes: No change in vision               Ears, Nose, Throat:  No change in hearing or congestion Skin: No rash or itching Cardiovascular: No chest pain, chest pressure or palpitations   Respiratory: No SOB or cough Gastrointestinal: See HPI and otherwise negative Genitourinary: No dysuria or change in urinary frequency  Neurological: No headache, dizziness or syncope Musculoskeletal: No new muscle or joint pain Hematologic: No bleeding or bruising Psychiatric: No history of depression or anxiety    Physical Exam:  Vital signs: BP 118/72   Pulse (!) 110   Ht 5' 7 (1.702 m)   Wt 130 lb (59 kg)   SpO2 97%   BMI 20.36 kg/m   Constitutional:   Pleasant  female appears to be in NAD, Well developed, Well nourished, alert and cooperative Eyes:   PEERL, EOMI. No icterus. Conjunctiva pink. Neck:  Supple Throat: Oral cavity and pharynx without inflammation, swelling or lesion.  Respiratory: Respirations even and unlabored. Lungs clear to auscultation bilaterally.   No wheezes,  crackles, or rhonchi.  Cardiovascular: Normal S1, S2. Regular rate and rhythm. No peripheral edema, cyanosis or pallor.  Gastrointestinal:  Soft, nondistended, nontender. No rebound or guarding.  Hypoactive bowel sounds. No appreciable masses or hepatomegaly. Rectal:  Not performed.  Msk:  Symmetrical without gross deformities. Without edema, no deformity or joint abnormality.  Neurologic:  Alert and  oriented x4;  grossly normal neurologically.  Skin:   Dry and intact without significant lesions or rashes.  RELEVANT LABS AND IMAGING: CBC    Latest Ref Rng & Units 03/15/2023   10:22 AM 12/11/2022   10:27 AM 06/04/2022    2:19 PM  CBC  WBC 3.4 - 10.8 x10E3/uL 3.5  3.5  6.1   Hemoglobin 11.1 - 15.9 g/dL 87.5  87.4  87.5   Hematocrit 34.0 - 46.6 % 39.0  37.3  35.9   Platelets 150 - 450 x10E3/uL 275  218  273.0      CMP     Latest Ref Rng & Units 12/11/2022   10:27 AM 06/04/2022    2:19 PM 12/01/2021   12:00 AM  CMP  Glucose 65 - 99 mg/dL 91  91  81   BUN 7 - 25 mg/dL 13  11  15    Creatinine 0.50 - 0.99 mg/dL 9.15  9.03  9.00   Sodium 135 - 146 mmol/L 139  135  139   Potassium 3.5 - 5.3 mmol/L 4.0  3.6  4.5   Chloride 98 - 110 mmol/L 106  101  106   CO2 20 - 32 mmol/L 26  27  25    Calcium 8.6 - 10.2 mg/dL 9.3  8.5  9.4   Total Protein 6.1 - 8.1 g/dL 6.9  7.0  6.8   Total Bilirubin 0.2 - 1.2 mg/dL 0.6  0.6  0.8   Alkaline Phos 39 - 117 U/L  46    AST 10 - 30 U/L 11  15  10    ALT 6 - 29 U/L 7  12  6       Lab Results  Component Value Date   TSH 2.04 12/11/2022  12/23 Pathogen panel- negative, CRP-1.1, sed rate-7, TTG and IgA level- negative   Colonoscopy 11/14/2019 by 2 Cirigliano: Recall 5 years - One 4 mm polyp in the cecum, removed with a cold snare. Resected and retrieved. - Diverticulosis in the ascending colon. - Tortuous sigmoid colon. - The distal rectum and anal verge are normal on retroflexion view. - The examined portion of the ileum was normal. - SESSILE SERRATED  POLYP (2 FRAGMENTS) - MULTIPLE FRAGMENTS OF BENIGN COLONIC MUCOSA - NO HIGH GRADE DYSPLASIA OR MALIGNANCY IDENTIFIED  Path: Surgical [P], colon, cecum, polyp - SESSILE SERRATED POLYP (2 FRAGMENTS) - MULTIPLE FRAGMENTS OF BENIGN COLONIC MUCOSA - NO HIGH GRADE  DYSPLASIA OR MALIGNANCY IDENTIFIED  Assessment: Encounter Diagnoses  Name Primary?   History of colonic polyps Yes   Incontinence of feces with fecal urgency    Altered bowel habits    Bloating    Flatulence    Abdominal discomfort   49 year old female patient that presents for evaluation of chronic diarrhea.  Patient was worked up in the past with GI profile negative for enteric infection, CRP and ESR negative for inflammatory disease.  Celiac panel negative.  Upon examination today patient exhibited hypoactive bowel sounds, we will go ahead and check abdominal x-ray to rule out obstipation as patient states she has had history of alternating bowels between diarrhea and constipation.  If negative x-ray will go ahead and pursue CT scan to rule out acute process.  Patient also complains of a lot of gas and bloat and we discussed considering SIBO test in the future.  Patient has history of colonic polyps and due for colon screening colonoscopy will go ahead and schedule today with random biopsy to rule out colitis.  Recommended the patient follow low FODMAP diet.   Plan: -Recommend low fodmap diet -Avoid food triggers -Add psyllium husk 1 tsp po daily  -Ordered abdominal xray to rule out constipation, if negative exam will order CT scan -If negative workup consider testing for SIBO -Schedule for a colonoscopy in [facility] with Dr. San. The risks and benefits of colonoscopy with possible polypectomy / biopsies were discussed and the patient agrees to proceed.   Thank you for the courtesy of this consult. Please call me with any questions or concerns.   Vici Novick, FNP-C [facility] 06/30/2024, 4:27 PM  Cc: Breeback, Jade  L, PA-C  "

## 2024-06-30 NOTE — Patient Instructions (Addendum)
 Altered bowel habits Recommend low fodmap diet Avoid food triggers Add psyllium husk 1 tsp po daily  Ordered abdominal xray to rule out constipation, if negative exam will order CT scan If negative workup consider testing for SIBO  We have sent the following medications to your pharmacy for you to pick up at your convenience: SUPREP  Your provider has requested that you have an abdominal x ray before leaving today. Please go to the basement floor to our Radiology department for the test.  You have been scheduled for a colonoscopy. Please follow written instructions given to you at your visit today.   If you use inhalers (even only as needed), please bring them with you on the day of your procedure.  DO NOT TAKE 7 DAYS PRIOR TO TEST- Trulicity (dulaglutide) Ozempic, Wegovy (semaglutide) Mounjaro, Zepbound (tirzepatide) Bydureon Bcise (exanatide extended release)  DO NOT TAKE 1 DAY PRIOR TO YOUR TEST Rybelsus (semaglutide) Adlyxin (lixisenatide) Victoza (liraglutide) Byetta (exanatide) ___________________________________________________________________________  Due to recent changes in healthcare laws, you may see the results of your imaging and laboratory studies on MyChart before your provider has had a chance to review them.  We understand that in some cases there may be results that are confusing or concerning to you. Not all laboratory results come back in the same time frame and the provider may be waiting for multiple results in order to interpret others.  Please give us  48 hours in order for your provider to thoroughly review all the results before contacting the office for clarification of your results.   _______________________________________________________  If your blood pressure at your visit was 140/90 or greater, please contact your primary care physician to follow up on this.  _______________________________________________________  If you are age 73 or older, your  body mass index should be between 23-30. Your Body mass index is 20.36 kg/m. If this is out of the aforementioned range listed, please consider follow up with your Primary Care Provider.  If you are age 29 or younger, your body mass index should be between 19-25. Your Body mass index is 20.36 kg/m. If this is out of the aformentioned range listed, please consider follow up with your Primary Care Provider.   ________________________________________________________  The [facility] providers would like to encourage you to use MYCHART to communicate with providers for non-urgent requests or questions.  Due to long hold times on the telephone, sending your provider a message by [facility] may be a faster and more efficient way to get a response.  Please allow 48 business hours for a response.  Please remember that this is for non-urgent requests.  _______________________________________________________  [facility] is using a team-based approach to care.  Your team is made up of your doctor and two to three APPS. Our APPS (Nurse Practitioners and Physician Assistants) work with your physician to ensure care continuity for you. They are fully qualified to address your health concerns and develop a treatment plan. They communicate directly with your gastroenterologist to care for you. Seeing the Advanced Practice Practitioners on your physician's team can help you by facilitating care more promptly, often allowing for earlier appointments, access to diagnostic testing, procedures, and other specialty referrals.   Thank you for trusting me with your gastrointestinal care. Deanna May, FNP-C

## 2024-06-30 NOTE — Progress Notes (Signed)
 Agree with the assessment and plan as outlined by [facility], FNP-C.  Carlitos Bottino, DO, [facility]

## 2024-07-03 ENCOUNTER — Telehealth: Admitting: Physician Assistant

## 2024-07-03 DIAGNOSIS — R109 Unspecified abdominal pain: Secondary | ICD-10-CM

## 2024-07-03 DIAGNOSIS — B9689 Other specified bacterial agents as the cause of diseases classified elsewhere: Secondary | ICD-10-CM

## 2024-07-03 DIAGNOSIS — J019 Acute sinusitis, unspecified: Secondary | ICD-10-CM

## 2024-07-03 DIAGNOSIS — R197 Diarrhea, unspecified: Secondary | ICD-10-CM

## 2024-07-03 MED ORDER — AMOXICILLIN-POT CLAVULANATE 875-125 MG PO TABS: 1.0000 | ORAL_TABLET | Freq: Two times a day (BID) | ORAL | 0 refills | Status: AC

## 2024-07-03 MED ORDER — FLUTICASONE PROPIONATE 50 MCG/ACT NA SUSP: 2.0000 | Freq: Every day | NASAL | 0 refills | Status: AC

## 2024-07-03 NOTE — Progress Notes (Signed)

## 2024-07-05 ENCOUNTER — Encounter: Admitting: Gastroenterology

## 2024-07-20 ENCOUNTER — Ambulatory Visit (HOSPITAL_COMMUNITY)
Admission: RE | Admit: 2024-07-20 | Discharge: 2024-07-20 | Disposition: A | Discharge status: Home / Self Care | Payer: Self-pay | Source: Ambulatory Visit | Attending: Gastroenterology | Admitting: Gastroenterology

## 2024-07-20 DIAGNOSIS — R109 Unspecified abdominal pain: Secondary | ICD-10-CM | POA: Insufficient documentation

## 2024-07-20 DIAGNOSIS — R197 Diarrhea, unspecified: Secondary | ICD-10-CM | POA: Insufficient documentation

## 2024-07-20 MED ORDER — IOHEXOL 9 MG/ML PO SOLN
500.0000 mL | ORAL | Status: AC
  Administered 2024-07-20 (×2): 500 mL via ORAL

## 2024-07-20 MED ORDER — IOHEXOL 300 MG/ML  SOLN
100.0000 mL | Freq: Once | INTRAMUSCULAR | Status: AC | PRN
  Administered 2024-07-20: 100 mL via INTRAVENOUS

## 2024-07-25 ENCOUNTER — Ambulatory Visit: Payer: Self-pay | Admitting: Gastroenterology

## 2024-07-26 ENCOUNTER — Encounter: Payer: Self-pay | Admitting: Gastroenterology

## 2024-07-26 ENCOUNTER — Ambulatory Visit: Admitting: Gastroenterology

## 2024-07-26 VITALS — BP 111/77 | HR 82 | Temp 97.9°F | Resp 13 | Ht 67.0 in | Wt 130.0 lb

## 2024-07-26 DIAGNOSIS — R197 Diarrhea, unspecified: Secondary | ICD-10-CM

## 2024-07-26 DIAGNOSIS — Z860101 Personal history of adenomatous and serrated colon polyps: Secondary | ICD-10-CM

## 2024-07-26 DIAGNOSIS — K640 First degree hemorrhoids: Secondary | ICD-10-CM

## 2024-07-26 DIAGNOSIS — D120 Benign neoplasm of cecum: Secondary | ICD-10-CM

## 2024-07-26 DIAGNOSIS — Z860100 Personal history of colon polyps, unspecified: Secondary | ICD-10-CM

## 2024-07-26 DIAGNOSIS — K529 Noninfective gastroenteritis and colitis, unspecified: Secondary | ICD-10-CM

## 2024-07-26 DIAGNOSIS — Z1211 Encounter for screening for malignant neoplasm of colon: Secondary | ICD-10-CM | POA: Diagnosis present

## 2024-07-26 DIAGNOSIS — R194 Change in bowel habit: Secondary | ICD-10-CM

## 2024-07-26 DIAGNOSIS — K5140 Inflammatory polyps of colon without complications: Secondary | ICD-10-CM | POA: Diagnosis not present

## 2024-07-26 MED ORDER — SODIUM CHLORIDE 0.9 % IV SOLN: 500.0000 mL | Freq: Once | INTRAVENOUS | Status: DC

## 2024-07-26 NOTE — Progress Notes (Signed)
 Called to room to assist during endoscopic procedure.  Patient ID and intended procedure confirmed with present staff. Received instructions for my participation in the procedure from the performing physician.

## 2024-07-26 NOTE — Progress Notes (Signed)
 Vss nad trans to pacu

## 2024-07-26 NOTE — Patient Instructions (Signed)
 Resume previous diet and medications. Awaiting pathology results. Repeat Colonoscopy date to be determined based on pathology results. Handouts provided on colon polyps and Henorrhoids.  YOU HAD AN ENDOSCOPIC PROCEDURE TODAY AT THE [facility]:   Refer to the procedure report that was given to you for any specific questions about what was found during the examination.  If the procedure report does not answer your questions, please call your gastroenterologist to clarify.  If you requested that your care partner not be given the details of your procedure findings, then the procedure report has been included in a sealed envelope for you to review at your convenience later.  YOU SHOULD EXPECT: Some feelings of bloating in the abdomen. Passage of more gas than usual.  Walking can help get rid of the air that was put into your GI tract during the procedure and reduce the bloating. If you had a lower endoscopy (such as a colonoscopy or flexible sigmoidoscopy) you may notice spotting of blood in your stool or on the toilet paper. If you underwent a bowel prep for your procedure, you may not have a normal bowel movement for a few days.  Please Note:  You might notice some irritation and congestion in your nose or some drainage.  This is from the oxygen used during your procedure.  There is no need for concern and it should clear up in a day or so.  SYMPTOMS TO REPORT IMMEDIATELY:  Following lower endoscopy (colonoscopy or flexible sigmoidoscopy):  Excessive amounts of blood in the stool  Significant tenderness or worsening of abdominal pains  Swelling of the abdomen that is new, acute  Fever of 100F or higher  For urgent or emergent issues, a gastroenterologist can be reached at any hour by calling [phone]. Do not use MyChart messaging for urgent concerns.    DIET:  We do recommend a small meal at first, but then you may proceed to your regular diet.  Drink plenty of fluids but you  should avoid alcoholic beverages for 24 hours.  ACTIVITY:  You should plan to take it easy for the rest of today and you should NOT DRIVE or use heavy machinery until tomorrow (because of the sedation medicines used during the test).    FOLLOW UP: Our staff will call the number listed on your records the next business day following your procedure.  We will call around 7:15- 8:00 am to check on you and address any questions or concerns that you may have regarding the information given to you following your procedure. If we do not reach you, we will leave a message.     If any biopsies were taken you will be contacted by phone or by letter within the next 1-3 weeks.  Please call us  at [phone] if you have not heard about the biopsies in 3 weeks.    SIGNATURES/CONFIDENTIALITY: You and/or your care partner have signed paperwork which will be entered into your electronic medical record.  These signatures attest to the fact that that the information above on your After Visit Summary has been reviewed and is understood.  Full responsibility of the confidentiality of this discharge information lies with you and/or your care-partner.

## 2024-07-26 NOTE — Progress Notes (Signed)
 "   GASTROENTEROLOGY PROCEDURE H&P NOTE   Primary Care Physician: Antoniette Vermell CROME, PA-C    Reason for Procedure:   Colon polyp surveillance, Diarrhea, change in bowel habits  Plan:    Colonoscopy  Patient is appropriate for endoscopic procedure(s) in the ambulatory ([facility]) setting.  The nature of the procedure, as well as the risks, benefits, and alternatives were carefully and thoroughly reviewed with the patient. Ample time for discussion and questions allowed. The patient understood, was satisfied, and agreed to proceed. I personally addressed all patient questions and concerns.     HPI: Carol Rodgers is a 49 y.o. female who presents for colonoscopy for ongoing polyp surveillance. Last colonsocopy was 10/2019 and notable for a subcentimeter cecal sessile serrated polyp and ascending colon diverticulosis, with recommendation to repeat in 5 years.  Separately, has been having change in bowel habits with nonbloody diarrhea, abdominal bloating, gas, cramping.  Was evaluated in the GI clinic for these issues on 06/30/2024.  Since then, abdominal x-ray showing diarrheal changes, and CT was unremarkable.  Otherwise no interval change in medical history since that appointment. Please refer to that note for full details regarding GI history and clinical presentation.   Past Medical History:  Diagnosis Date   History of stomach ulcers    age 67   Mental disorder    No pertinent past medical history     Past Surgical History:  Procedure Laterality Date   CESAREAN SECTION     CESAREAN SECTION  06/07/2012   Procedure: CESAREAN SECTION;  Surgeon: Truman Corona, MD;  Location: [facility];  Service: Obstetrics;  Laterality: N/A;  Repeat edc 06/10/12   ESOPHAGOGASTRODUODENOSCOPY     age 19   NASAL SINUS SURGERY     TONSILLECTOMY      Prior to Admission medications  Medication Sig Start Date End Date Taking? Authorizing Provider  DULoxetine  (CYMBALTA ) 30 MG capsule TAKE 1 CAPSULE BY MOUTH DAILY  06/06/24  Yes Breeback, Jade L, PA-C  traZODone  (DESYREL ) 50 MG tablet TAKE 1/2 TO 1 TABLET BY MOUTH AT BEDTIME AS NEEDED FOR SLEEP 10/04/23  Yes Breeback, Jade L, PA-C  amoxicillin -clavulanate (AUGMENTIN ) 875-125 MG tablet Take 1 tablet by mouth 2 (two) times daily. Patient not taking: Reported on 07/26/2024 07/03/24   Vivienne Delon HERO, PA-C  fluticasone  (FLONASE ) 50 MCG/ACT nasal spray Place 2 sprays into both nostrils daily. 07/03/24   Vivienne Delon HERO, PA-C    Current Outpatient Medications  Medication Sig Dispense Refill   DULoxetine  (CYMBALTA ) 30 MG capsule TAKE 1 CAPSULE BY MOUTH DAILY 90 capsule 3   traZODone  (DESYREL ) 50 MG tablet TAKE 1/2 TO 1 TABLET BY MOUTH AT BEDTIME AS NEEDED FOR SLEEP 90 tablet 3   amoxicillin -clavulanate (AUGMENTIN ) 875-125 MG tablet Take 1 tablet by mouth 2 (two) times daily. (Patient not taking: Reported on 07/26/2024) 14 tablet 0   fluticasone  (FLONASE ) 50 MCG/ACT nasal spray Place 2 sprays into both nostrils daily. 16 g 0   Current Facility-Administered Medications  Medication Dose Route Frequency Provider Last Rate Last Admin   0.9 %  sodium chloride  infusion  500 mL Intravenous Once Trenese Haft V, DO        Allergies as of 07/26/2024 - Review Complete 07/26/2024  Allergen Reaction Noted   Viibryd  [vilazodone  hcl] Other (See Comments) 10/28/2015    Family History  Problem Relation Age of Onset   Lung cancer Maternal Grandfather    Hypertension Paternal Grandfather    Heart attack Paternal Grandfather  Colon cancer Other        in maternal great aunt and uncle   Esophageal cancer Neg Hx    Rectal cancer Neg Hx    Stomach cancer Neg Hx     Social History   Socioeconomic History   Marital status: Married    Spouse name: Not on file   Number of children: 2   Years of education: Not on file   Highest education level: Master's degree (e.g., MA, MS, MEng, MEd, MSW, MBA)  Occupational History   Occupation: homemaker  Tobacco Use    Smoking status: Former   Smokeless tobacco: Never  Advertising Account Planner   Vaping status: Never Used  Substance and Sexual Activity   Alcohol use: Yes    Comment: socially   Drug use: No   Sexual activity: Yes    Birth control/protection: None  Other Topics Concern   Not on file  Social History Narrative   Not on file   Social Drivers of Health   Tobacco Use: Medium Risk (07/26/2024)   Patient History    Smoking Tobacco Use: Former    Smokeless Tobacco Use: Never    Passive Exposure: Not on Actuary Strain: Low Risk (12/07/2022)   Overall Financial Resource Strain (CARDIA)    Difficulty of Paying Living Expenses: Not hard at all  Food Insecurity: No Food Insecurity (12/07/2022)   Hunger Vital Sign    Worried About Running Out of Food in the Last Year: Never true    Ran Out of Food in the Last Year: Never true  Transportation Needs: No Transportation Needs (12/07/2022)   PRAPARE - Administrator, Civil Service (Medical): No    Lack of Transportation (Non-Medical): No  Physical Activity: Insufficiently Active (12/07/2022)   Exercise Vital Sign    Days of Exercise per Week: 2 days    Minutes of Exercise per Session: 10 min  Stress: Stress Concern Present (12/07/2022)   [employer] of Occupational Health - Occupational Stress Questionnaire    Feeling of Stress : To some extent  Social Connections: Moderately Integrated (12/07/2022)   Social Connection and Isolation Panel    Frequency of Communication with Friends and Family: Three times a week    Frequency of Social Gatherings with Friends and Family: Three times a week    Attends Religious Services: More than 4 times per year    Active Member of Clubs or Organizations: No    Attends Banker Meetings: Not on file    Marital Status: Married  Intimate Partner Violence: Unknown (10/25/2022)   Received from [facility]   HITS    Physically Hurt: Not on file    Insult or Talk Down To: Not on  file    Threaten Physical Harm: Not on file    Scream or Curse: Not on file  Depression (PHQ2-9): Medium Risk (03/15/2023)   Depression (PHQ2-9)    PHQ-2 Score: 9  Alcohol Screen: Low Risk (12/07/2022)   Alcohol Screen    Last Alcohol Screening Score (AUDIT): 4  Housing: Low Risk (12/07/2022)   Housing    Last Housing Risk Score: 0  Utilities: Not on file  Health Literacy: Not on file    Physical Exam: Vital signs in last 24 hours: @BP  118/84   Pulse (!) 120   Temp 97.9 F (36.6 C)   Ht 5' 7 (1.702 m)   Wt 130 lb (59 kg)   LMP 07/03/2024   SpO2 98%  BMI 20.36 kg/m  GEN: NAD EYE: Sclerae anicteric ENT: MMM CV: Non-tachycardic Pulm: CTA b/l GI: Soft, NT/ND NEURO:  Alert & Oriented x 3   Sandor Flatter, DO [facility]   07/26/2024 11:05 AM  "

## 2024-07-26 NOTE — Progress Notes (Signed)
 Pt's states no medical or surgical changes since previsit or office visit.

## 2024-07-26 NOTE — Op Note (Signed)
 [facility] Patient Name: Carol Rodgers Procedure Date: 07/26/2024 11:00 AM MRN: [id number] Endoscopist: Sandor Flatter , MD, [id number] Age: 49 Referring MD:  Date of Birth: July 18, 1975 Gender: Female Account #: [id number] Procedure:                Colonoscopy Indications:              High risk colon cancer surveillance: Personal                            history of sessile serrated colon polyp (less than                            10 mm in size) with no dysplasia                           Last colonsocopy was 10/2019 and notable for a                            subcentimeter cecal sessile serrated polyp and                            ascending colon diverticulosis, with recommendation                            to repeat in 5 years.                           Separately, has been having change in bowel habits                            with nonbloody diarrhea, fecal urgency, abdominal                            bloating, gas, cramping. Medicines:                Monitored Anesthesia Care Procedure:                Pre-Anesthesia Assessment:                           - Prior to the procedure, a History and Physical                            was performed, and patient medications and                            allergies were reviewed. The patient's tolerance of                            previous anesthesia was also reviewed. The risks                            and benefits of the procedure and the sedation  options and risks were discussed with the patient.                            All questions were answered, and informed consent                            was obtained. Prior Anticoagulants: The patient has                            taken no anticoagulant or antiplatelet agents. ASA                            Grade Assessment: II - A patient with mild systemic                            disease. After reviewing the risks and benefits,                             the patient was deemed in satisfactory condition to                            undergo the procedure.                           After obtaining informed consent, the colonoscope                            was passed under direct vision. Throughout the                            procedure, the patient's blood pressure, pulse, and                            oxygen saturations were monitored continuously. The                            [id number]-HQ190L Colonoscope [id number] was introduced                            through the anus and advanced to the the terminal                            ileum. The terminal ileum, ileocecal valve,                            appendiceal orifice, and rectum were photographed. Scope In: 11:17:30 AM Scope Out: 11:41:55 AM Scope Withdrawal Time: 0 hours 18 minutes 14 seconds  Total Procedure Duration: 0 hours 24 minutes 25 seconds  Findings:                 The perianal and digital rectal examinations were                            normal.  A 4 mm polyp was found in the cecum. The polyp was                            sessile. The polyp was removed with a cold snare.                            Resection and retrieval were complete. Estimated                            blood loss was minimal.                           Normal mucosa was found in the entire colon.                            Biopsies for histology were taken with a cold                            forceps from the right colon and left colon for                            evaluation of microscopic colitis. Estimated blood                            loss was minimal.                           Non-bleeding internal hemorrhoids were found during                            retroflexion. The hemorrhoids were small and Grade                            I (internal hemorrhoids that do not prolapse).                           The terminal ileum appeared  normal. Complications:            No immediate complications. Estimated Blood Loss:     Estimated blood loss was minimal. Impression:               - One 4 mm polyp in the cecum, removed with a cold                            snare. Resected and retrieved.                           - Normal mucosa in the entire examined colon.                            Biopsied.                           - Non-bleeding internal hemorrhoids.                           -  The examined portion of the ileum was normal. Recommendation:           - Patient has a contact number available for                            emergencies. The signs and symptoms of potential                            delayed complications were discussed with the                            patient. Return to normal activities tomorrow.                            Written discharge instructions were provided to the                            patient.                           - Resume previous diet.                           - Continue present medications.                           - Await pathology results.                           - Repeat colonoscopy for surveillance based on                            pathology results.                           - Return to GI office PRN. Sandor Flatter, MD 07/26/2024 11:55:32 AM

## 2024-07-27 ENCOUNTER — Telehealth: Payer: Self-pay | Admitting: *Deleted

## 2024-07-27 NOTE — Telephone Encounter (Signed)
 No answer for follow up. Left a message.

## 2024-07-28 NOTE — Telephone Encounter (Signed)
 Returned pts call.  She states that she did fine yesterday with no issues.  She was able to eat and resume all normal activities. This morning she had no issues as well.  This afternoon she started having really bad pain in her abdomen and on her right side.  She says that she cannot get comfortable and states that her pain is 10/10.  She says that she feel she passed a large amount of gas yesterday.

## 2024-07-28 NOTE — Telephone Encounter (Signed)
 Incoming call from pt regarding symptoms following procedure. Pt was scheduled for 07/26/2024 Pt stated she is experiencing right sided abdominal pain that travels to her back. Please advise. Thank you.

## 2024-07-31 ENCOUNTER — Other Ambulatory Visit: Payer: Self-pay | Admitting: Physician Assistant

## 2024-07-31 DIAGNOSIS — F5101 Primary insomnia: Secondary | ICD-10-CM

## 2024-07-31 LAB — SURGICAL PATHOLOGY

## 2024-08-04 ENCOUNTER — Ambulatory Visit: Payer: Self-pay | Admitting: Gastroenterology
